# Patient Record
Sex: Female | Born: 1990 | Race: White | Hispanic: No | Marital: Married | Smoking: Never smoker
Health system: Southern US, Community
[De-identification: ages and names within clinical notes are randomized; demographics above are authoritative.]

## PROBLEM LIST (undated history)

## (undated) DIAGNOSIS — K589 Irritable bowel syndrome without diarrhea: Secondary | ICD-10-CM

## (undated) DIAGNOSIS — K7689 Other specified diseases of liver: Secondary | ICD-10-CM

## (undated) DIAGNOSIS — E161 Other hypoglycemia: Secondary | ICD-10-CM

## (undated) HISTORY — PX: NO PAST SURGERIES: SHX2092

## (undated) HISTORY — PX: WISDOM TOOTH EXTRACTION: SHX21

---
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
## 2008-02-17 ENCOUNTER — Encounter: Payer: Self-pay | Admitting: Gastroenterology

## 2008-02-17 ENCOUNTER — Ambulatory Visit: Payer: Self-pay | Admitting: Gastroenterology

## 2008-02-17 LAB — CONVERTED CEMR LAB
AST: 38 units/L — ABNORMAL HIGH (ref 0–37)
BUN: 8 mg/dL (ref 6–23)
Basophils Relative: 1 % (ref 0–1)
Calcium: 10.1 mg/dL (ref 8.4–10.5)
Chloride: 104 meq/L (ref 96–112)
Creatinine, Ser: 0.67 mg/dL (ref 0.40–1.20)
Eosinophils Absolute: 0.1 10*3/uL (ref 0.0–0.7)
Glucose, Bld: 85 mg/dL (ref 70–99)
Hemoglobin: 13.9 g/dL (ref 12.0–15.0)
IgA: 99 mg/dL (ref 68–378)
Lymphs Abs: 2.2 10*3/uL (ref 0.7–4.0)
MCHC: 33.9 g/dL (ref 30.0–36.0)
MCV: 84.7 fL (ref 78.0–100.0)
Monocytes Absolute: 0.5 10*3/uL (ref 0.1–1.0)
Monocytes Relative: 9 % (ref 3–12)
Neutro Abs: 2.6 10*3/uL (ref 1.7–7.7)
RBC: 4.84 M/uL (ref 3.87–5.11)
Tissue Transglutaminase Ab, IgA: 0.1 units (ref <7)

## 2008-02-19 ENCOUNTER — Encounter: Payer: Self-pay | Admitting: Gastroenterology

## 2008-02-25 ENCOUNTER — Ambulatory Visit (HOSPITAL_COMMUNITY): Admission: RE | Admit: 2008-02-25 | Discharge: 2008-02-25 | Payer: Self-pay | Admitting: Internal Medicine

## 2010-07-11 NOTE — Assessment & Plan Note (Signed)
Connie [name], Connie [name]                   CHART#:  [id number]   DATE:  02/17/2008                       DOB:  04-26-1990   CHIEF COMPLAINT:  Diarrhea.   HISTORY OF PRESENT ILLNESS:  The patient is a 20 year old college  student who presents today as a self referral for a 30-month history of  chronic diarrhea.  She does have some alternating constipation with it,  but predominantly diarrhea.  She denies any nocturnal diarrhea.  She  notes mostly during meals, before she can even finish eating she is  having to run to the bathroom.  Stools are loose.  She has 4 or 5 bowel  movements a day.  Denies any blood in the stool or melena.  No nausea or  vomiting.  No weight loss.  She did eliminate dairy productus, but it  did not seem to help.  She stopped eating fast food and started  preparing her own meals, but did not notice any significant difference.  It does not really matter what she eats, she is going to have diarrhea.  She also has diarrhea whenever she gets stressed out in school.  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  She denies any  heartburn, dysphagia, odynophagia, ill contacts, or family history of  IBD.   MEDICATIONS:  NuvaRing.   ALLERGIES:  No known drug allergies.   PAST MEDICAL HISTORY:  Hypoglycemia.   FAMILY HISTORY:  Mother is 6 years old, had some sort of surgery for  history of diarrhea, but the patient is not aware of the details.  No  family history of colon cancer, Crohn disease, or ulcerative colitis to  her knowledge.   SOCIAL HISTORY:  She is single.  She is a full-time Theatre stage manager at  [employer].  She is a nonsmoker, rarely consumes alcohol about once  every couple of months.   REVIEW OF SYSTEMS:  See HPI for GI and constitutional.  Cardiopulmonary:  Denies chest pain, shortness of breath, palpitations, or cough.  Genitourinary:  Denies dysuria or hematuria.  Menstrual cycles regular  for her, on NuvaRing.   PHYSICAL  EXAMINATION:  VITAL SIGNS:  Weight 109, height 5 feet 2 inches,  BMI is 19.9 and normal, temp 97.8, blood pressure 100/70, and pulse 72.  GENERAL:  A pleasant, well-nourished, well-developed Caucasian female in  no acute distress.  SKIN:  Warm and dry.  No jaundice.  HEENT:  Sclerae nonicteric.  Oropharyngeal mucosa moist and pink.  No  lesions, erythema, or exudate.  No lymphadenopathy or thyromegaly.  CHEST:  Lungs are clear to auscultation.  CARDIOVASCULAR:  Regular rate and rhythm.  Normal S1 and S2.  No  murmurs, rubs, or gallops.  ABDOMEN:  Positive bowel sounds.  Abdomen is soft, nontender, and  nondistended.  No organomegaly or masses.  No rebound or guarding.  No  abdominal bruits or hernia.  LOWER EXTREMITIES:  No edema.   IMPRESSION:  The patient is a 20 year old female with a 73-month history  of chronic diarrhea with some intermittent constipation.  Symptoms are  worse postprandially.  I suspect we are dealing with irritable bowel  syndrome, but we do need to consider other possibilities such as  inflammatory bowel disease and celiac disease.  She already eliminated  dairy products  without any relief of her symptoms.  She does not  describe any alarm symptoms at this time.   PLAN:  1. Trial of HyoMax 0.375 mg p.o. b.i.d. p.r.n. diarrhea, #60 with 3      refills.  2. Serum tTG level, IgA level, CBC, TSH, and CMET.  3. Stool for O&P, culture, C. diff, and lactoferrin.  4. Stool for Hemoccult x3.  5. Probiotic 1 daily, #30, samples provided.  6. IBS literature provided.  7. Further recommendations to follow.       Tana Coast, P.A.  Electronically Signed     Kassie Mends, M.D.  Electronically Signed    LL/MEDQ  D:  02/17/2008  T:  02/17/2008  Job:  [id number]

## 2013-06-24 DIAGNOSIS — IMO0002 Reserved for concepts with insufficient information to code with codable children: Secondary | ICD-10-CM | POA: Insufficient documentation

## 2013-06-25 ENCOUNTER — Ambulatory Visit: Payer: Self-pay | Admitting: Cardiology

## 2013-06-30 ENCOUNTER — Encounter: Payer: Self-pay | Admitting: *Deleted

## 2013-08-10 ENCOUNTER — Encounter: Payer: Self-pay | Admitting: Cardiology

## 2013-09-23 ENCOUNTER — Ambulatory Visit (INDEPENDENT_AMBULATORY_CARE_PROVIDER_SITE_OTHER): Payer: BC Managed Care – PPO | Admitting: Cardiology

## 2013-09-23 ENCOUNTER — Encounter: Payer: Self-pay | Admitting: Cardiology

## 2013-09-23 VITALS — BP 112/82 | HR 68 | Ht 62.0 in | Wt 118.0 lb

## 2013-09-23 DIAGNOSIS — R079 Chest pain, unspecified: Secondary | ICD-10-CM

## 2013-09-23 DIAGNOSIS — R002 Palpitations: Secondary | ICD-10-CM

## 2013-09-23 NOTE — Progress Notes (Signed)
      Clinical Summary Connie [name] is a 23 y.o.female  1. Chest pain/Palpitations - approx 1 year. Chest pain is sharp, left chest. 6/10 in severity, can increase 9/10. Can occur at rest or with exertion, though more related to exertion. +SOB. Can have some diaphoresis. Feeling of heart beating fast. Nothing makes pain better or worst. Pain lasts approx 5-15 minutes. Occurs approx once a week. Increased frequency over the last year. ? About stress - exercises regularly, typically runs 1/4 mile, then walks 1/2 mile, then runs another 1/4 mile. Denies symptoms with this activity. - rare caffeine, rare EtOH   CAD risk factors: maternal grandfather MI age 30, mother HTN and tachycardia.    Denies any PMH   No Known Allergies   Current Outpatient Prescriptions  Medication Sig Dispense Refill  . Norgestim-Eth Estrad Triphasic (TRINESSA, 28, PO) Take by mouth.       No current facility-administered medications for this visit.     No past surgical history on file.   No Known Allergies    No family history on file.   Social History Ms. Dormer reports that she has never smoked. She does not have any smokeless tobacco history on file. Ms. Bury has no alcohol history on file.   Review of Systems CONSTITUTIONAL: No weight loss, fever, chills, weakness or fatigue.  HEENT: Eyes: No visual loss, blurred vision, double vision or yellow sclerae.No hearing loss, sneezing, congestion, runny nose or sore throat.  SKIN: No rash or itching.  CARDIOVASCULAR: per HPI RESPIRATORY: No shortness of breath, cough or sputum.  GASTROINTESTINAL: No anorexia, nausea, vomiting or diarrhea. No abdominal pain or blood.  GENITOURINARY: No burning on urination, no polyuria NEUROLOGICAL: No headache, dizziness, syncope, paralysis, ataxia, numbness or tingling in the extremities. No change in bowel or bladder control.  MUSCULOSKELETAL: No muscle, back pain, joint pain or stiffness.  LYMPHATICS:  No enlarged nodes. No history of splenectomy.  PSYCHIATRIC: No history of depression or anxiety.  ENDOCRINOLOGIC: No reports of sweating, cold or heat intolerance. No polyuria or polydipsia.  Marland Kitchen   Physical Examination Filed Vitals:   09/23/13 0834  BP: 112/82  Pulse: 68   Filed Weights   09/23/13 0834  Weight: 118 lb (53.524 kg)    Gen: resting comfortably, no acute distress HEENT: no scleral icterus, pupils equal round and reactive, no palptable cervical adenopathy,  CV: RRR, no m/r/g, no JVD, no carotid bruits Resp: Clear to auscultation bilaterally GI: abdomen is soft, non-tender, non-distended, normal bowel sounds, no hepatosplenomegaly MSK: extremities are warm, no edema.  Skin: warm, no rash Neuro:  no focal deficits Psych: appropriate affect   Diagnostic Studies EKG NSR    Assessment and Plan  1. Chest pain/palpitations - unclear etiology, unlikely ischemic heart disease given her young age and limited risk factors - symptoms could be related to symptomatic arrhythmia - will obtain GXT to evalute exercise functional capacity, for ischemia, and for any exercised induced arrhythmias. If normal, likely obtain 14 day event monitor.   F/u pending test results   Arnoldo Lenis, M.D., F.A.C.C.

## 2013-09-23 NOTE — Patient Instructions (Signed)
Your physician recommends that you schedule a follow-up appointment in: to be determined after stress test    Your physician has requested that you have an exercise tolerance test. For further information please visit [email or web address]. Please also follow instruction sheet, as given.       Thank you for choosing [facility] !

## 2013-09-28 ENCOUNTER — Encounter: Payer: Self-pay | Admitting: Cardiology

## 2013-10-09 ENCOUNTER — Telehealth: Payer: Self-pay

## 2013-10-09 ENCOUNTER — Ambulatory Visit (HOSPITAL_COMMUNITY)
Admission: RE | Admit: 2013-10-09 | Discharge: 2013-10-09 | Disposition: A | Discharge status: Home / Self Care | Payer: BC Managed Care – PPO | Source: Ambulatory Visit | Attending: Cardiology | Admitting: Cardiology

## 2013-10-09 DIAGNOSIS — R079 Chest pain, unspecified: Secondary | ICD-10-CM | POA: Diagnosis not present

## 2013-10-09 DIAGNOSIS — R002 Palpitations: Secondary | ICD-10-CM

## 2013-10-09 NOTE — Telephone Encounter (Signed)
Message copied by Bernita Raisin on Fri Oct 09, 2013  4:20 PM ------      Message from: Corona F      Created: Fri Oct 09, 2013  4:00 PM       Stress test looks good. If she is still having symptoms she needs a 14 day event monitor                  Zandra Abts MD ------

## 2013-10-09 NOTE — Telephone Encounter (Signed)
MD note below relayed to pt

## 2013-10-09 NOTE — Progress Notes (Addendum)
Stress Lab Nurses Notes - Connie [name]  Connie [name] 10/09/2013 Reason for doing test: Chest Pain and Palpitation Type of test: Regular GTX Nurse performing test: Gerrit Halls, RN Nuclear Medicine Tech: Not Applicable Echo Tech: Not Applicable MD performing test: Shayma Pfefferle/K.Purcell Nails NP Family MD: Hilma Favors Test explained and consent signed: Yes.   IV started: No IV started Symptoms: Fatigue in legs Treatment/Intervention: None Reason test stopped: fatigue After recovery IV was: [name] Patient to return to Nuc. Med at : [name] Patient discharged: Home Patient's Condition upon discharge was: stable Comments: During test peak BP 170/82 & HR 187.  Recovery BP 116/61 & HR 102.  Symptoms resolved in recovery.  Connie [name] T  Attending Note  Patient exercised according to the Bruce protocol for 9 min 40 sec achieving 12.3 METs. Resting heart rate increased from 89 bpm to 187 bpm (95% of max predicted) and blood pressure increased from 100/78 to 170/82. The test was stopped due to fatigue, she did not experience any chest pain.   Baseline EKG showed normal sinus rhythm. Stress EKG showed no specific ischemic changes and no significant arrhythmias   Findings 1. Negative stress EKG for ischemia 2. No exercise induced arrhythmias 3. Duke treadmill score of 10, consistent with low risk for major cardiac events   Connie Abts MD

## 2015-02-27 DIAGNOSIS — K7689 Other specified diseases of liver: Secondary | ICD-10-CM

## 2015-02-27 HISTORY — DX: Other specified diseases of liver: K76.89

## 2015-11-17 DIAGNOSIS — N390 Urinary tract infection, site not specified: Secondary | ICD-10-CM | POA: Diagnosis not present

## 2015-11-24 ENCOUNTER — Ambulatory Visit (HOSPITAL_COMMUNITY)
Admission: RE | Admit: 2015-11-24 | Discharge: 2015-11-24 | Disposition: A | Discharge status: Home / Self Care | Payer: BLUE CROSS/BLUE SHIELD | Source: Ambulatory Visit | Attending: Registered Nurse | Admitting: Registered Nurse

## 2015-11-24 ENCOUNTER — Emergency Department (HOSPITAL_COMMUNITY): Payer: BLUE CROSS/BLUE SHIELD

## 2015-11-24 ENCOUNTER — Encounter (HOSPITAL_COMMUNITY): Payer: Self-pay | Admitting: Emergency Medicine

## 2015-11-24 ENCOUNTER — Emergency Department (HOSPITAL_COMMUNITY)
Admission: EM | Admit: 2015-11-24 | Discharge: 2015-11-24 | Disposition: A | Discharge status: Home / Self Care | Payer: BLUE CROSS/BLUE SHIELD | Attending: Emergency Medicine | Admitting: Emergency Medicine

## 2015-11-24 ENCOUNTER — Other Ambulatory Visit (HOSPITAL_COMMUNITY): Payer: Self-pay | Admitting: Registered Nurse

## 2015-11-24 DIAGNOSIS — K7689 Other specified diseases of liver: Secondary | ICD-10-CM | POA: Diagnosis not present

## 2015-11-24 DIAGNOSIS — K219 Gastro-esophageal reflux disease without esophagitis: Secondary | ICD-10-CM | POA: Diagnosis not present

## 2015-11-24 DIAGNOSIS — R102 Pelvic and perineal pain: Secondary | ICD-10-CM | POA: Diagnosis not present

## 2015-11-24 DIAGNOSIS — K589 Irritable bowel syndrome without diarrhea: Secondary | ICD-10-CM | POA: Diagnosis not present

## 2015-11-24 DIAGNOSIS — Z1389 Encounter for screening for other disorder: Secondary | ICD-10-CM | POA: Diagnosis not present

## 2015-11-24 DIAGNOSIS — R160 Hepatomegaly, not elsewhere classified: Secondary | ICD-10-CM

## 2015-11-24 DIAGNOSIS — R1032 Left lower quadrant pain: Secondary | ICD-10-CM | POA: Diagnosis not present

## 2015-11-24 DIAGNOSIS — R109 Unspecified abdominal pain: Secondary | ICD-10-CM

## 2015-11-24 DIAGNOSIS — R1013 Epigastric pain: Secondary | ICD-10-CM | POA: Diagnosis not present

## 2015-11-24 DIAGNOSIS — Z6820 Body mass index (BMI) 20.0-20.9, adult: Secondary | ICD-10-CM | POA: Diagnosis not present

## 2015-11-24 HISTORY — DX: Other hypoglycemia: E16.1

## 2015-11-24 HISTORY — DX: Irritable bowel syndrome, unspecified: K58.9

## 2015-11-24 LAB — CBC WITH DIFFERENTIAL/PLATELET
BASOS ABS: 0 10*3/uL (ref 0.0–0.1)
Basophils Relative: 0 %
Eosinophils Absolute: 0.1 10*3/uL (ref 0.0–0.7)
Eosinophils Relative: 1 %
HEMATOCRIT: 38.8 % (ref 36.0–46.0)
Hemoglobin: 13.4 g/dL (ref 12.0–15.0)
LYMPHS PCT: 33 %
Lymphs Abs: 2.5 10*3/uL (ref 0.7–4.0)
MCH: 30.3 pg (ref 26.0–34.0)
MCHC: 34.5 g/dL (ref 30.0–36.0)
MCV: 87.8 fL (ref 78.0–100.0)
MONO ABS: 0.5 10*3/uL (ref 0.1–1.0)
MONOS PCT: 6 %
NEUTROS ABS: 4.6 10*3/uL (ref 1.7–7.7)
Neutrophils Relative %: 60 %
Platelets: 244 10*3/uL (ref 150–400)
RBC: 4.42 MIL/uL (ref 3.87–5.11)
RDW: 11.7 % (ref 11.5–15.5)
WBC: 7.7 10*3/uL (ref 4.0–10.5)

## 2015-11-24 LAB — URINALYSIS, ROUTINE W REFLEX MICROSCOPIC
Bilirubin Urine: NEGATIVE
GLUCOSE, UA: NEGATIVE mg/dL
HGB URINE DIPSTICK: NEGATIVE
Ketones, ur: 15 mg/dL — AB
Leukocytes, UA: NEGATIVE
Nitrite: NEGATIVE
PH: 7 (ref 5.0–8.0)
Protein, ur: NEGATIVE mg/dL
SPECIFIC GRAVITY, URINE: 1.015 (ref 1.005–1.030)

## 2015-11-24 LAB — COMPREHENSIVE METABOLIC PANEL
ALT: 21 U/L (ref 14–54)
AST: 44 U/L — AB (ref 15–41)
Albumin: 4.2 g/dL (ref 3.5–5.0)
Alkaline Phosphatase: 44 U/L (ref 38–126)
Anion gap: 7 (ref 5–15)
BILIRUBIN TOTAL: 0.8 mg/dL (ref 0.3–1.2)
BUN: 11 mg/dL (ref 6–20)
CALCIUM: 9.1 mg/dL (ref 8.9–10.3)
CO2: 26 mmol/L (ref 22–32)
CREATININE: 0.8 mg/dL (ref 0.44–1.00)
Chloride: 103 mmol/L (ref 101–111)
GFR calc Af Amer: >60 mL/min (ref >60)
Glucose, Bld: 89 mg/dL (ref 65–99)
POTASSIUM: 3.6 mmol/L (ref 3.5–5.1)
Sodium: 136 mmol/L (ref 135–145)
TOTAL PROTEIN: 7.3 g/dL (ref 6.5–8.1)

## 2015-11-24 LAB — WET PREP, GENITAL
Clue Cells Wet Prep HPF POC: NONE SEEN
Sperm: NONE SEEN
Trich, Wet Prep: NONE SEEN
Yeast Wet Prep HPF POC: NONE SEEN

## 2015-11-24 LAB — PREGNANCY, URINE: PREG TEST UR: NEGATIVE

## 2015-11-24 LAB — LIPASE, BLOOD: LIPASE: 22 U/L (ref 11–51)

## 2015-11-24 MED ORDER — OMEPRAZOLE 20 MG PO CPDR: 20.0000 mg | DELAYED_RELEASE_CAPSULE | Freq: Every day | ORAL | 0 refills | Status: DC

## 2015-11-24 NOTE — Discharge Instructions (Signed)
Follow-up with your doctor for the MRI of your liver. Take the stomach medication as prescribed. Return to the ED if you develop new or worsening symptoms.

## 2015-11-24 NOTE — ED Provider Notes (Signed)
[facility] DEPT Provider Note   CSN: [id number] Arrival date & time: 11/24/15  1459     History   Chief Complaint Chief Complaint  Patient presents with  . Abdominal Pain    HPI Connie [name] is a 25 y.o. female.  Patient sent by PCP for abnormal abdominal CT. Reports she's had lower abdominal pain for the past 2 weeks it is fairly constant. She was seen by urgent care and told no UTI was given antibiotics anyway which she completed. Pain is constant in the left side of her lower abdomen. Nothing makes it better or worse. No vomiting or fever. Normal bowel movements. Does have a history of IBS. Eating and drinking well. No fever. No abnormal bleeding or discharge. CT scan obtained by PCP today showed abdomen in the liver consistent with mass or cyst. She was sent here for further workup including possible MRI even though this does not correlate with her area of pain.   The history is provided by the patient.  Abdominal Pain   Pertinent negatives include diarrhea, nausea, vomiting, constipation and dysuria.    Past Medical History:  Diagnosis Date  . Hypoglycemic reaction   . IBS (irritable bowel syndrome)     Patient Active Problem List   Diagnosis Date Noted  . Body mass index between 19-24, adult 06/24/2013    Past Surgical History:  Procedure Laterality Date  . WISDOM TOOTH EXTRACTION      OB History    No data available       Home Medications    Prior to Admission medications   Medication Sig Start Date End Date Taking? Authorizing Provider  Norgestim-Eth Estrad Triphasic (TRINESSA, 28, PO) Take by mouth.   Yes Historical Provider, MD    Family History Family History  Problem Relation Age of Onset  . Hypertension Mother   . Heart attack Other     Social History Social History  Substance Use Topics  . Smoking status: Never Smoker  . Smokeless tobacco: Never Used  . Alcohol use No     Allergies   Review of patient's allergies indicates no  known allergies.   Review of Systems Review of Systems  Constitutional: Negative for activity change and appetite change.  HENT: Negative for congestion and rhinorrhea.   Respiratory: Negative for cough, chest tightness and shortness of breath.   Cardiovascular: Negative for chest pain.  Gastrointestinal: Positive for abdominal pain. Negative for constipation, diarrhea, nausea and vomiting.  Genitourinary: Positive for pelvic pain. Negative for dysuria and vaginal discharge.  Neurological: Negative for dizziness, seizures, light-headedness and numbness.  A complete 10 system review of systems was obtained and all systems are negative except as noted in the HPI and PMH.     Physical Exam Updated Vital Signs BP 115/72 (BP Location: Left Arm)   Pulse 88   Temp 97.7 F (36.5 C) (Oral)   Resp 18   Ht 5\' 2"  (1.575 m)   Wt 110 lb (49.9 kg)   LMP 11/14/2015   SpO2 99%   BMI 20.12 kg/m   Physical Exam  Constitutional: She is oriented to person, place, and time. She appears well-developed and well-nourished. No distress.  HENT:  Head: Normocephalic and atraumatic.  Mouth/Throat: Oropharynx is clear and moist. No oropharyngeal exudate.  Eyes: Conjunctivae and EOM are normal. Pupils are equal, round, and reactive to light.  Neck: Normal range of motion. Neck supple.  No meningismus.  Cardiovascular: Normal rate, regular rhythm, normal heart sounds and intact  distal pulses.   No murmur heard. Pulmonary/Chest: Effort normal and breath sounds normal. No respiratory distress.  Abdominal: Soft. There is tenderness. There is no rebound and no guarding.  TTP suprapubic and LLQ pain without guarding or rebound  Genitourinary:  Genitourinary Comments: Chaperone present. Normal external genitalia. White discharge in vaginal vault. No CMT. There is midline uterine tenderness and left adnexal tenderness. No right adnexal tenderness  Musculoskeletal: Normal range of motion. She exhibits no edema  or tenderness.  Neurological: She is alert and oriented to person, place, and time. No cranial nerve deficit. She exhibits normal muscle tone. Coordination normal.  No ataxia on finger to nose bilaterally. No pronator drift. 5/5 strength throughout. CN 2-12 intact.Equal grip strength. Sensation intact.   Skin: Skin is warm.  Psychiatric: She has a normal mood and affect. Her behavior is normal.  Nursing note and vitals reviewed.    ED Treatments / Results  Labs (all labs ordered are listed, but only abnormal results are displayed) Labs Reviewed  WET PREP, GENITAL - Abnormal; Notable for the following:       Result Value   WBC, Wet Prep HPF POC MODERATE (*)    All other components within normal limits  URINALYSIS, ROUTINE W REFLEX MICROSCOPIC (NOT AT [facility]) - Abnormal; Notable for the following:    Ketones, ur 15 (*)    All other components within normal limits  COMPREHENSIVE METABOLIC PANEL - Abnormal; Notable for the following:    AST 44 (*)    All other components within normal limits  PREGNANCY, URINE  CBC WITH DIFFERENTIAL/PLATELET  LIPASE, BLOOD  GC/CHLAMYDIA PROBE AMP ([place]) NOT AT [facility]    EKG  EKG Interpretation None       Radiology Ct Renal Stone Study  Result Date: 11/24/2015 CLINICAL DATA:  Abdominal pain, diffuse. EXAM: CT ABDOMEN AND PELVIS WITHOUT CONTRAST TECHNIQUE: Multidetector CT imaging of the abdomen and pelvis was performed following the standard protocol without IV contrast. COMPARISON:  Small bowel follow-through fluoroscopy 02/25/2008 FINDINGS: Lower chest: No pulmonary nodules or pleural effusion. No visible pericardial effusion. Hepatobiliary: There is a focal abnormality along the inferior contour of the right hepatic lobe (axial image 32, coronal image 26) with a central area of hypoattenuation. This masslike area measures approximately 7 x 5 cm. The remainder of the liver is normal. The gallbladder is normal. There is no ascites. Pancreas:  Normal noncontrast appearance of the pancreas. No peripancreatic fluid collection. Spleen: Normal. Adrenal glands: Normal. Urinary Tract: --Right kidney: No hydronephrosis or perinephric stranding. No nephrolithiasis. No obstructing ureteral stones. --Left kidney: No hydronephrosis or perinephric stranding. No nephrolithiasis. No obstructing ureteral stones. --Urinary bladder: Unremarkable. Stomach/Bowel: No dilated loops of bowel. No evidence of colonic or enteric inflammation. No fluid collection within the abdomen. The appendix could not be adequately visualized. However, there is no inflammatory stranding or free fluid in the right lower quadrant. Vascular/Lymphatic: No abdominal aortic aneurysm or atherosclerotic calcification. No abdominal or pelvic lymphadenopathy. Reproductive: Normal uterus and ovaries. Musculoskeletal. No focal osseous lesion. Normal visualized extraperitoneal and extrathoracic soft tissues. IMPRESSION: 1. Inferior right hepatic lobe mass measuring approximately 7 x 5 cm, with a central area of hypoattenuation. 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 MRI of the abdomen with and without IV contrast, using a biliary specific contrast agent such as Eovist, is recommended for further characterization. 2. No acute abnormality of the abdomen or pelvis. 3. No obstructive uropathy  or nephrolithiasis. Electronically Signed   By: Ulyses Jarred M.D.   On: 11/24/2015 14:13    Procedures Procedures (including critical care time)  Medications Ordered in ED Medications - No data to display   Initial Impression / Assessment and Plan / ED Course  I have reviewed the triage vital signs and the nursing notes.  Pertinent labs & imaging results that were available during my care of the patient were reviewed by me and considered in my medical decision making (see chart for details).  Clinical Course   2 weeks of lower  abdominal pain without associated symptoms.  CT shows R hepatic lobe mass concerning for adenoma versus focal nodular hyperplasia.  Discussed with patient that her lower abdominal pain is likely not explained by her liver abnormality. Discussed the MRI can be obtained but it might cost her more when ordered as an emergent test. Patient states she does not want the MRI today and will follow-up with her PCP to get this scheduled as an outpatient.  Pelvic exam as above with left adnexal tenderness. We'll obtain pelvic ultrasound. Urinalysis is negative. Labs are reassuring.  Pelvic ultrasound is normal. No evidence of ovarian torsion or cyst.  Patient to follow-up with PCP as well as GI. We'll start PPI for her epigastric pain. She understands need for MRI follow-up regarding liver findings on CT. Return precautions discussed.  Final Clinical Impressions(s) / ED Diagnoses   Final diagnoses:  Pelvic pain in female  Abdominal pain, unspecified abdominal location  Liver mass    New Prescriptions New Prescriptions   No medications on file     Ezequiel Essex, MD 11/25/15 0121

## 2015-11-24 NOTE — ED Triage Notes (Signed)
Pt reports lower abdominal pain that started 2 weeks ago. Pt states she was seen by PCP today, had CT scan which showed an abnormality on her liver. Sent by PCP for further follow-up.

## 2015-11-24 NOTE — ED Notes (Signed)
Pt having generalized pressure in abdomen, went to doctor today and had ct done, found a possible mass or cyst on liver.  Pt has  Had normal bms and no urinary symptoms.  Pt reports a "pressure" in her abdomen after having BM.  Pt alert and oriented, here to have MRI and bloodwork done.

## 2015-11-25 ENCOUNTER — Encounter: Payer: Self-pay | Admitting: Gastroenterology

## 2015-11-25 ENCOUNTER — Ambulatory Visit (HOSPITAL_COMMUNITY)
Admission: RE | Admit: 2015-11-25 | Discharge: 2015-11-25 | Disposition: A | Discharge status: Home / Self Care | Payer: BLUE CROSS/BLUE SHIELD | Source: Ambulatory Visit | Attending: Gastroenterology | Admitting: Gastroenterology

## 2015-11-25 ENCOUNTER — Ambulatory Visit (INDEPENDENT_AMBULATORY_CARE_PROVIDER_SITE_OTHER): Payer: BLUE CROSS/BLUE SHIELD | Admitting: Gastroenterology

## 2015-11-25 ENCOUNTER — Telehealth: Payer: Self-pay | Admitting: Gastroenterology

## 2015-11-25 ENCOUNTER — Other Ambulatory Visit: Payer: Self-pay

## 2015-11-25 ENCOUNTER — Telehealth: Payer: Self-pay

## 2015-11-25 VITALS — BP 119/72 | HR 101 | Temp 97.2°F | Ht 62.0 in | Wt 112.6 lb

## 2015-11-25 DIAGNOSIS — R160 Hepatomegaly, not elsewhere classified: Secondary | ICD-10-CM

## 2015-11-25 DIAGNOSIS — R1030 Lower abdominal pain, unspecified: Secondary | ICD-10-CM | POA: Diagnosis not present

## 2015-11-25 DIAGNOSIS — R1013 Epigastric pain: Secondary | ICD-10-CM | POA: Diagnosis not present

## 2015-11-25 DIAGNOSIS — N281 Cyst of kidney, acquired: Secondary | ICD-10-CM | POA: Diagnosis not present

## 2015-11-25 DIAGNOSIS — R1032 Left lower quadrant pain: Secondary | ICD-10-CM | POA: Insufficient documentation

## 2015-11-25 DIAGNOSIS — K7689 Other specified diseases of liver: Secondary | ICD-10-CM | POA: Diagnosis not present

## 2015-11-25 LAB — GC/CHLAMYDIA PROBE AMP (~~LOC~~) NOT AT ARMC
Chlamydia: NEGATIVE
Neisseria Gonorrhea: NEGATIVE

## 2015-11-25 MED ORDER — GADOXETATE DISODIUM 0.25 MMOL/ML IV SOLN
5.0000 mL | Freq: Once | INTRAVENOUS | Status: AC | PRN
  Administered 2015-11-25: 5 mL via INTRAVENOUS

## 2015-11-25 NOTE — Progress Notes (Signed)
Primary Care Physician:  Timblin  Primary Gastroenterologist:  Garfield Cornea, MD   Chief Complaint  Patient presents with  . Abdominal Pain    HPI:  Connie [name] is a 25 y.o. female here for urgent office visit for further evaluation of liver mass, abdominal pain.  Patient was seen by our practice remotely for bowel issues suspicious for IBS. Small bowel follow-through negative at that time. She also had negative celiac serologies. Over this course of several years, patient has baseline postprandial abdominal bloating/distention associated with lower abdominal discomfort. Usually followed by diarrhea. She has been able to manage her symptoms fairly well sticking with Paleo diet/avoiding gluten.  Several weeks ago she received a Z-Pak for sinusitis. Shortly after those symptoms she began having constant pressure-like discomfort in the lower abdomen predominantly left-sided. Pain persisted, she cannot get in to see her PCP so she went to urgent care. She thought she may have a UTI but her urine checked out okay. She was on her period at that time but did not feel like menstrual-related issues. She was given an antibiotic NP or clue for possible early UTI. She completed antibiotic without any improvement of her symptoms. She went back to see her PCP who ordered a CT renal protocol, no IV contrast given. This was performed yesterday. She was noted to have focal abnormality along the inferior contour the right hepatic lobe with central area of hypoattenuation measuring 7 x 5 cm. PCP recommended she go to the emergency department for urgent MRI. One seen in the emergency department, urgent MRI was not encouraged. She did undergo evaluation for her lower abdominal pain. Pelvic exam was performed. She also received pelvic ultrasound which was unremarkable. Pregnancy test was negative. Urinalysis unremarkable. CBC, lipase, any function normal. AST was 44.  Patient states that her  bowel movements have been more regular than usual although stool consistency is more of constipation. Really gets no relief with with her abdominal discomfort after bowel movements. Denies dysuria. She also notes epigastric discomfort which is new, severe heartburn 3 times in the past week. ER physician provided her with Prilosec prescription but she hasn't started yet. She's never had heartburn before. Denies nausea or vomiting. No fever.     Current Outpatient Prescriptions  Medication Sig Dispense Refill  . Probiotic Product (PROBIOTIC DAILY PO) Take by mouth daily.     No current facility-administered medications for this visit.     Allergies as of 11/25/2015  . (No Known Allergies)    Past Medical History:  Diagnosis Date  . Hypoglycemic reaction   . IBS (irritable bowel syndrome)     Past Surgical History:  Procedure Laterality Date  . WISDOM TOOTH EXTRACTION      Family History  Problem Relation Age of Onset  . Hypertension Mother   . Heart attack Other     Social History   Social History  . Marital status: Married    Spouse name: N/A  . Number of children: N/A  . Years of education: N/A   Occupational History  . Not on file.   Social History Main Topics  . Smoking status: Never Smoker  . Smokeless tobacco: Never Used  . Alcohol use No  . Drug use: No  . Sexual activity: Yes    Birth control/ protection: Pill   Other Topics Concern  . Not on file   Social History Narrative  . No narrative on file      ROS:  General:  Negative for anorexia, weight loss, fever, chills, fatigue, weakness. Eyes: Negative for vision changes.  ENT: Negative for hoarseness, difficulty swallowing , nasal congestion. CV: Negative for chest pain, angina, palpitations, dyspnea on exertion, peripheral edema.  Respiratory: Negative for dyspnea at rest, dyspnea on exertion, cough, sputum, wheezing.  GI: See history of present illness. GU:  Negative for dysuria, hematuria,  urinary incontinence, urinary frequency, nocturnal urination.  MS: Negative for joint pain, low back pain.  Derm: Negative for rash or itching.  Neuro: Negative for weakness, abnormal sensation, seizure, frequent headaches, memory loss, confusion.  Psych: Negative for anxiety, depression, suicidal ideation, hallucinations.  Endo: Negative for unusual weight change.  Heme: Negative for bruising or bleeding. Allergy: Negative for rash or hives.    Physical Examination:  BP 119/72   Pulse (!) 101   Temp 97.2 F (36.2 C) (Oral)   Ht 5\' 2"  (1.575 m)   Wt 112 lb 9.6 oz (51.1 kg)   LMP 11/14/2015   BMI 20.59 kg/m    General: Well-nourished, well-developed in no acute distress.  Head: Normocephalic, atraumatic.   Eyes: Conjunctiva pink, no icterus. Mouth: Oropharyngeal mucosa moist and pink , no lesions erythema or exudate. Neck: Supple without thyromegaly, masses, or lymphadenopathy.  Lungs: Clear to auscultation bilaterally.  Heart: Regular rate and rhythm, no murmurs rubs or gallops.  Abdomen: Bowel sounds are normal, mild epigastric and left lower quadrant tenderness with palpation, mild tenderness in the right upper quadrant with deep inspiration nondistended, no hepatosplenomegaly or masses, no abdominal bruits or    hernia , no rebound or guarding.   Rectal: Not performed Extremities: No lower extremity edema. No clubbing or deformities.  Neuro: Alert and oriented x 4 , grossly normal neurologically.  Skin: Warm and dry, no rash or jaundice.   Psych: Alert and cooperative, normal mood and affect.  Labs: Lab Results  Component Value Date   LIPASE 22 11/24/2015   Lab Results  Component Value Date   CREATININE 0.80 11/24/2015   BUN 11 11/24/2015   NA 136 11/24/2015   K 3.6 11/24/2015   CL 103 11/24/2015   CO2 26 11/24/2015   Lab Results  Component Value Date   ALT 21 11/24/2015   AST 44 (H) 11/24/2015   ALKPHOS 44 11/24/2015   BILITOT 0.8 11/24/2015   Lab Results   Component Value Date   WBC 7.7 11/24/2015   HGB 13.4 11/24/2015   HCT 38.8 11/24/2015   MCV 87.8 11/24/2015   PLT 244 11/24/2015     Imaging Studies: US Transvaginal Non-ob  Result Date: 11/24/2015 CLINICAL DATA:  Pelvic pain for 2 weeks.  LMP 11/14/2015. EXAM: TRANSABDOMINAL AND TRANSVAGINAL ULTRASOUND OF PELVIS DOPPLER ULTRASOUND OF OVARIES TECHNIQUE: Both transabdominal and transvaginal ultrasound examinations of the pelvis were performed. Transabdominal technique was performed for global imaging of the pelvis including uterus, ovaries, adnexal regions, and pelvic cul-de-sac. It was necessary to proceed with endovaginal exam following the transabdominal exam to visualize the endometrium and ovaries. Color and duplex Doppler ultrasound was utilized to evaluate blood flow to the ovaries. COMPARISON:  None. FINDINGS: Uterus Measurements: 6.4 x 2.3 x 3.5 cm. No fibroids or other mass visualized. Endometrium Thickness: 3 mm.  No focal abnormality visualized. Right ovary Measurements: 3.1 x 2.2 x 2.2 cm. Normal appearance/no adnexal mass. Left ovary Measurements: 2.6 x 1.8 x 2.8 cm. Normal appearance/no adnexal mass. Pulsed Doppler evaluation of both ovaries demonstrates normal low-resistance arterial and venous waveforms. Other findings No abnormal free fluid.  IMPRESSION: Normal appearance of uterus and ovaries. No pelvic mass or other sonographic abnormality demonstrated. No sonographic evidence for ovarian torsion. Electronically Signed   By: Earle Gell M.D.   On: 11/24/2015 17:32   US Pelvis Complete  Result Date: 11/24/2015 CLINICAL DATA:  Pelvic pain for 2 weeks.  LMP 11/14/2015. EXAM: TRANSABDOMINAL AND TRANSVAGINAL ULTRASOUND OF PELVIS DOPPLER ULTRASOUND OF OVARIES TECHNIQUE: Both transabdominal and transvaginal ultrasound examinations of the pelvis were performed. Transabdominal technique was performed for global imaging of the pelvis including uterus, ovaries, adnexal regions, and pelvic  cul-de-sac. It was necessary to proceed with endovaginal exam following the transabdominal exam to visualize the endometrium and ovaries. Color and duplex Doppler ultrasound was utilized to evaluate blood flow to the ovaries. COMPARISON:  None. FINDINGS: Uterus Measurements: 6.4 x 2.3 x 3.5 cm. No fibroids or other mass visualized. Endometrium Thickness: 3 mm.  No focal abnormality visualized. Right ovary Measurements: 3.1 x 2.2 x 2.2 cm. Normal appearance/no adnexal mass. Left ovary Measurements: 2.6 x 1.8 x 2.8 cm. Normal appearance/no adnexal mass. Pulsed Doppler evaluation of both ovaries demonstrates normal low-resistance arterial and venous waveforms. Other findings No abnormal free fluid. IMPRESSION: Normal appearance of uterus and ovaries. No pelvic mass or other sonographic abnormality demonstrated. No sonographic evidence for ovarian torsion. Electronically Signed   By: Earle Gell M.D.   On: 11/24/2015 17:32   [place] Art/ven Flow Abd Pelv Doppler  Result Date: 11/24/2015 CLINICAL DATA:  Pelvic pain for 2 weeks.  LMP 11/14/2015. EXAM: TRANSABDOMINAL AND TRANSVAGINAL ULTRASOUND OF PELVIS DOPPLER ULTRASOUND OF OVARIES TECHNIQUE: Both transabdominal and transvaginal ultrasound examinations of the pelvis were performed. Transabdominal technique was performed for global imaging of the pelvis including uterus, ovaries, adnexal regions, and pelvic cul-de-sac. It was necessary to proceed with endovaginal exam following the transabdominal exam to visualize the endometrium and ovaries. Color and duplex Doppler ultrasound was utilized to evaluate blood flow to the ovaries. COMPARISON:  None. FINDINGS: Uterus Measurements: 6.4 x 2.3 x 3.5 cm. No fibroids or other mass visualized. Endometrium Thickness: 3 mm.  No focal abnormality visualized. Right ovary Measurements: 3.1 x 2.2 x 2.2 cm. Normal appearance/no adnexal mass. Left ovary Measurements: 2.6 x 1.8 x 2.8 cm. Normal appearance/no adnexal mass. Pulsed Doppler  evaluation of both ovaries demonstrates normal low-resistance arterial and venous waveforms. Other findings No abnormal free fluid. IMPRESSION: Normal appearance of uterus and ovaries. No pelvic mass or other sonographic abnormality demonstrated. No sonographic evidence for ovarian torsion. Electronically Signed   By: Earle Gell M.D.   On: 11/24/2015 17:32   Ct Renal Stone Study  Result Date: 11/24/2015 CLINICAL DATA:  Abdominal pain, diffuse. EXAM: CT ABDOMEN AND PELVIS WITHOUT CONTRAST TECHNIQUE: Multidetector CT imaging of the abdomen and pelvis was performed following the standard protocol without IV contrast. COMPARISON:  Small bowel follow-through fluoroscopy 02/25/2008 FINDINGS: Lower chest: No pulmonary nodules or pleural effusion. No visible pericardial effusion. Hepatobiliary: There is a focal abnormality along the inferior contour of the right hepatic lobe (axial image 32, coronal image 26) with a central area of hypoattenuation. This masslike area measures approximately 7 x 5 cm. The remainder of the liver is normal. The gallbladder is normal. There is no ascites. Pancreas: Normal noncontrast appearance of the pancreas. No peripancreatic fluid collection. Spleen: Normal. Adrenal glands: Normal. Urinary Tract: --Right kidney: No hydronephrosis or perinephric stranding. No nephrolithiasis. No obstructing ureteral stones. --Left kidney: No hydronephrosis or perinephric stranding. No nephrolithiasis. No obstructing ureteral stones. --Urinary bladder: Unremarkable. Stomach/Bowel:  No dilated loops of bowel. No evidence of colonic or enteric inflammation. No fluid collection within the abdomen. The appendix could not be adequately visualized. However, there is no inflammatory stranding or free fluid in the right lower quadrant. Vascular/Lymphatic: No abdominal aortic aneurysm or atherosclerotic calcification. No abdominal or pelvic lymphadenopathy. Reproductive: Normal uterus and ovaries. Musculoskeletal.  No focal osseous lesion. Normal visualized extraperitoneal and extrathoracic soft tissues. IMPRESSION: 1. Inferior right hepatic lobe mass measuring approximately 7 x 5 cm, with a central area of hypoattenuation. 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 MRI of the abdomen with and without IV contrast, using a biliary specific contrast agent such as Eovist, is recommended for further characterization. 2. No acute abnormality of the abdomen or pelvis. 3. No obstructive uropathy or nephrolithiasis. Electronically Signed   By: Ulyses Jarred M.D.   On: 11/24/2015 14:13

## 2015-11-25 NOTE — Patient Instructions (Signed)
1. Start omeprazole as recommended. 2. MRI liver as scheduled.  3. I will touch base with Dr. Gala Romney regarding your symptoms. I will call you later today with recommendations.

## 2015-11-25 NOTE — Telephone Encounter (Signed)
Called pt to inform of stat MRI scheduled at [facility] today at 3:00pm. NPO 6-8 hours before. Pt stated she is hypoglycemic and hasn't had anything to eat today. She asked if she could have hard candy. Per radiology at [facility] she can have hard candy but none that is creamy or red. Advised pt.

## 2015-11-25 NOTE — Progress Notes (Signed)
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 Benign renal cysts as well. Spoke with the patient. Etiology of her lower abd pain not well defined. Suggested prilosec for epig pain. Probiotic and paleo/gluten free diet which we have discussed in past. Symptoms began after zpak and she had abx for empirical tx of UTI. No diarrhea so Cdiff is not likely. ?IBS flare.   Patient to call when she gets back in town with progress report or sooner if neededn.  Repeat MRI Abd with and WO contrast (use Eovist) to document stability of liver mass.

## 2015-11-25 NOTE — Telephone Encounter (Signed)
I personally spoke to MRI Tech at [facility]. They are aware of indications for MRI. Both liver lesion and pelvic pain/LLQ pain.  They will update order accordingly.

## 2015-11-25 NOTE — Assessment & Plan Note (Signed)
25 year old female with several week history of lower abdominal discomfort predominantly left lower quadrant, epigastric discomfort seemingly unrelated to bowel function. Baseline bowel issues for years, suspect did IBS. Current symptoms seem to be different. No urinary symptoms. Chronic postprandial bloating and bowel issues generally controlled with diet.  Patient underwent noncontrast CT which revealed a 5 x 7 cm abnormality in the inferior right hepatic lobe which needs to be evaluated. It is unlikely that this finding explains her new onset lower abdominal discomfort. I discussed case with Dr. Gala Romney, he recommends MRI abdomen and pelvis today with without contrast, needs EOVIST contrast to evaluate liver abnormality. Patient willing. Also will go ahead and start omeprazole for epigastric discomfort. Further recommendations to follow.

## 2015-11-28 NOTE — Progress Notes (Signed)
CC'D TO PCP °

## 2015-12-02 ENCOUNTER — Telehealth: Payer: Self-pay | Admitting: Internal Medicine

## 2015-12-02 NOTE — Telephone Encounter (Signed)
Pt was seen recently by LSL and was told to call back to let us know how she was doing after her trip. She said that LSL could call her back on Monday at [phone] (epic has [phone])

## 2015-12-02 NOTE — Telephone Encounter (Signed)
Routing to LSL 

## 2015-12-05 ENCOUNTER — Ambulatory Visit (HOSPITAL_COMMUNITY): Payer: BLUE CROSS/BLUE SHIELD

## 2015-12-05 NOTE — Telephone Encounter (Signed)
Tried to call pt- NA- LMOM 

## 2015-12-05 NOTE — Telephone Encounter (Signed)
Please contact patient for progress report and we will go from there.

## 2015-12-07 NOTE — Progress Notes (Signed)
Dr. Gala Romney, can you look at MRI findings? Do you want me to repeat MRI in 6 months?

## 2015-12-08 NOTE — Telephone Encounter (Signed)
Tried to call pt- NA- LMOM 

## 2015-12-09 NOTE — Telephone Encounter (Signed)
Pt called- she said everything has seemed to calm down for now. She is taking the prilosec prn and the probiotic daily and it helps. She said she is watching her diet and popcorn makes things worse. She said she also noticed that her lower abd pain is worse after intercourse and she is going to call her GYN about this.

## 2015-12-19 NOTE — Telephone Encounter (Signed)
Please make sure patient is aware of RMR recommendations, copy and pasted from the MR result note.  "May be an incidental finding BUT a 7.7 cm hepatic mass is pretty big. Agree with the recent recommendations given. However, in the next several weeks if patient's right-sided symptoms are not improved, I think we need to keep in mind the possibility that the hepatic mass is somehow contributing to her right-sided abdominal pain although pain not anatomically located in close proximity to the liver. I would not wait 6-12 months to repeat image, particularly if persisting symptoms. If symptoms persist, would consider consultation with a hepatobiliary surgeon at [facility]."   So, at this point, we will plan on Repeat MRI Abd with and WO contrast (use Eovist) to document stability of liver mass in 03/2016.  If persistent symptoms, then send to hepatobiliary surgeon at [facility].   Return here to see RMR only in 6-8 months.

## 2015-12-19 NOTE — Progress Notes (Signed)
See separate telephone note.

## 2015-12-20 DIAGNOSIS — Z6820 Body mass index (BMI) 20.0-20.9, adult: Secondary | ICD-10-CM | POA: Diagnosis not present

## 2015-12-20 DIAGNOSIS — Z01419 Encounter for gynecological examination (general) (routine) without abnormal findings: Secondary | ICD-10-CM | POA: Diagnosis not present

## 2015-12-27 NOTE — Telephone Encounter (Signed)
Tried to call pt- NA- LMOM 

## 2016-01-01 NOTE — Telephone Encounter (Signed)
Connie [name], when you talk with make sure she gets NIC for MRI as outlined 03/2016.  I also meant for the OV with RMR only to be in 6-8 WEEKS not months.

## 2016-01-02 ENCOUNTER — Encounter: Payer: Self-pay | Admitting: Internal Medicine

## 2016-01-02 NOTE — Telephone Encounter (Signed)
Tried to call pt- NA- LMOM with recommendations.  

## 2016-01-02 NOTE — Telephone Encounter (Signed)
ON RECALL AND APPOINTMENT MADE °

## 2016-02-07 ENCOUNTER — Ambulatory Visit (INDEPENDENT_AMBULATORY_CARE_PROVIDER_SITE_OTHER): Payer: BLUE CROSS/BLUE SHIELD | Admitting: Internal Medicine

## 2016-02-07 ENCOUNTER — Encounter: Payer: Self-pay | Admitting: Internal Medicine

## 2016-02-07 VITALS — BP 118/73 | HR 87 | Temp 98.3°F | Ht 62.0 in | Wt 111.4 lb

## 2016-02-07 DIAGNOSIS — R160 Hepatomegaly, not elsewhere classified: Secondary | ICD-10-CM | POA: Diagnosis not present

## 2016-02-07 DIAGNOSIS — K588 Other irritable bowel syndrome: Secondary | ICD-10-CM | POA: Diagnosis not present

## 2016-02-07 MED ORDER — HYOSCYAMINE SULFATE SL 0.125 MG SL SUBL: 0.1250 mg | SUBLINGUAL_TABLET | Freq: Three times a day (TID) | SUBLINGUAL | 3 refills | Status: DC

## 2016-02-07 NOTE — Patient Instructions (Signed)
Hepatic profile today  Keep Appointment for repeat MRI in February  Trial of Levsin .125 mg - one under the tongue before meals and at bedtime as needed for abdominal symptoms  Further recommendations to follow

## 2016-02-10 ENCOUNTER — Telehealth: Payer: Self-pay | Admitting: Internal Medicine

## 2016-02-10 ENCOUNTER — Other Ambulatory Visit (HOSPITAL_COMMUNITY)
Admission: RE | Admit: 2016-02-10 | Discharge: 2016-02-10 | Disposition: A | Discharge status: Home / Self Care | Payer: BLUE CROSS/BLUE SHIELD | Source: Ambulatory Visit | Attending: Internal Medicine | Admitting: Internal Medicine

## 2016-02-10 DIAGNOSIS — R160 Hepatomegaly, not elsewhere classified: Secondary | ICD-10-CM | POA: Insufficient documentation

## 2016-02-10 LAB — HEPATIC FUNCTION PANEL
ALBUMIN: 4.6 g/dL (ref 3.5–5.0)
ALT: 27 U/L (ref 14–54)
AST: 51 U/L — ABNORMAL HIGH (ref 15–41)
Alkaline Phosphatase: 52 U/L (ref 38–126)
BILIRUBIN TOTAL: 0.9 mg/dL (ref 0.3–1.2)
Bilirubin, Direct: 0.1 mg/dL (ref 0.1–0.5)
Indirect Bilirubin: 0.8 mg/dL (ref 0.3–0.9)
TOTAL PROTEIN: 7.9 g/dL (ref 6.5–8.1)

## 2016-02-10 NOTE — Telephone Encounter (Signed)
noted 

## 2016-02-10 NOTE — Telephone Encounter (Signed)
Patient called to let us know that she had her lab work done this morning. She is aware that the nurse would contact her with results when available.

## 2016-02-17 ENCOUNTER — Ambulatory Visit: Payer: BLUE CROSS/BLUE SHIELD | Admitting: Internal Medicine

## 2016-02-26 NOTE — Progress Notes (Signed)
Primary Care Physician:  [facility] Primary Gastroenterologist:  Dr. Hilma Favors  Pre-Procedure History & Physical: HPI:  Connie [name] is a 25 y.o. female here for follow up of crampy llq abd pain and tendency towards diarrhea along with right lobe liver mass. 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  Minimal elevation in AST previously.  Pt continue to deny any right sided abd symptoms.   Chronic GI sx have been intermittant over a period of years.  Hx of IBS.  No family hx of liver disease. On BCP's.  Desires to start a family in 2018.  Past Medical History:  Diagnosis Date  . Hypoglycemic reaction   . IBS (irritable bowel syndrome)     Past Surgical History:  Procedure Laterality Date  . WISDOM TOOTH EXTRACTION      Prior to Admission medications   Medication Sig Start Date End Date Taking? Authorizing Provider  prenatal vitamin w/FE, FA (PRENATAL 1 + 1) 27-1 MG TABS tablet Take 1 tablet by mouth daily at 12 noon.   Yes Historical Provider, MD  Probiotic Product (PROBIOTIC DAILY PO) Take by mouth daily.   Yes Historical Provider, MD  Hyoscyamine Sulfate SL (LEVSIN/SL) 0.125 MG SUBL Place 0.125 mg under the tongue 4 (four) times daily -  before meals and at bedtime. 02/07/16   Daneil Dolin, MD    Allergies as of 02/07/2016  . (No Known Allergies)    Family History  Problem Relation Age of Onset  . Hypertension Mother   . Heart attack Other     Social History   Social History  . Marital status: Married    Spouse name: N/A  . Number of children: N/A  . Years of education: N/A   Occupational History  . Not on file.   Social History Main Topics  . Smoking status: Never Smoker  . Smokeless tobacco: Never Used  . Alcohol use No  . Drug use: No  . Sexual activity: Yes    Birth control/ protection: Pill   Other Topics Concern  . Not on file   Social History Narrative  .  No narrative on file    Review of Systems: See HPI, otherwise negative ROS  Physical Exam: BP 118/73   Pulse 87   Temp 98.3 F (36.8 C) (Oral)   Ht 5\' 2"  (1.575 m)   Wt 111 lb 6.4 oz (50.5 kg)   LMP 02/06/2016   BMI 20.38 kg/m  General:   Alert,  Well-developed, well-nourished, pleasant and cooperative in NAD Skin:  Intact without significant lesions or rashes. Eyes:  Sclera clear, no icterus.   Conjunctiva pink. Lungs:  Clear throughout to auscultation.   No wheezes, crackles, or rhonchi. No acute distress. Heart:  Regular rate and rhythm; no murmurs, clicks, rubs,  or gallops. Abdomen: Non-distended, normal bowel sounds.  Soft and nontender without appreciable mass or hepatosplenomegaly.  Pulses:  Normal pulses noted. Extremities:  Without clubbing or edema.  Impression:   25 y/o lady with longstanding fluctuating symptoms consistent with IBS.  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 7.7 cm size - on the larger side for these lesions. Nothing to suggest malignancy.  On oral contraceptives.   Hopefully, liver lesion can be managed conservatively. As discussed, patient will hold up on starting a family until F/U imaging performed.  Recommendations:  Hepatic profile today  Keep Appointment for  repeat MRI in February  Trial of Levsin .125 mg - one under the tongue before meals and at bedtime as needed for abdominal symptoms  Further recommendations to follow       Notice: This dictation was prepared with Dragon dictation along with smaller phrase technology. Any transcriptional errors that result from this process are unintentional and may not be corrected upon review.

## 2016-03-01 ENCOUNTER — Telehealth: Payer: Self-pay | Admitting: Internal Medicine

## 2016-03-01 NOTE — Telephone Encounter (Signed)
Letter mailed

## 2016-03-01 NOTE — Telephone Encounter (Signed)
FEB RECALL FOR MRI °

## 2016-03-09 ENCOUNTER — Telehealth: Payer: Self-pay

## 2016-03-09 ENCOUNTER — Other Ambulatory Visit: Payer: Self-pay

## 2016-03-09 DIAGNOSIS — R160 Hepatomegaly, not elsewhere classified: Secondary | ICD-10-CM

## 2016-03-09 NOTE — Telephone Encounter (Signed)
Pt called office to schedule MRI (she received letter in the mail). MRI abdomen with and without contrast scheduled for 04/04/16 at 9:00 am at [facility]. Arrive at 8:45 am. NPO 4 hours prior to test. Called and informed pt. Letter also mailed.

## 2016-03-12 NOTE — Telephone Encounter (Signed)
PA for MRI abdomen with and without contrast submitted via [employer]. Request [place]: In Progress.

## 2016-03-19 NOTE — Telephone Encounter (Signed)
Received fax from [facility], request for MRI abdomen with/without contrast was not approved.   Connie [name], can you please call [phone] to provide additional info.

## 2016-03-20 NOTE — Telephone Encounter (Signed)
Dx used was liver mass.

## 2016-03-20 NOTE — Telephone Encounter (Signed)
What did we use for diagnosis?

## 2016-03-22 NOTE — Telephone Encounter (Signed)
Spoke to Ginger, apparently case went to appeals department and we are waiting call back.

## 2016-03-26 NOTE — Telephone Encounter (Signed)
Clinical notes faxed to Appeals dept (fax: [phone]). Danae Chen (nurse) called and requested notes be faxed to appeals dept.

## 2016-03-27 NOTE — Telephone Encounter (Signed)
[employer] called PA# [id number] for MRI. Called and informed pt it had been approved.

## 2016-03-29 NOTE — Telephone Encounter (Signed)
Thanks

## 2016-04-04 ENCOUNTER — Ambulatory Visit (HOSPITAL_COMMUNITY)
Admission: RE | Admit: 2016-04-04 | Discharge: 2016-04-04 | Disposition: A | Discharge status: Home / Self Care | Payer: BLUE CROSS/BLUE SHIELD | Source: Ambulatory Visit | Attending: Gastroenterology | Admitting: Gastroenterology

## 2016-04-04 ENCOUNTER — Telehealth: Payer: Self-pay

## 2016-04-04 DIAGNOSIS — N281 Cyst of kidney, acquired: Secondary | ICD-10-CM | POA: Insufficient documentation

## 2016-04-04 DIAGNOSIS — K7689 Other specified diseases of liver: Secondary | ICD-10-CM | POA: Diagnosis not present

## 2016-04-04 DIAGNOSIS — R160 Hepatomegaly, not elsewhere classified: Secondary | ICD-10-CM

## 2016-04-04 MED ORDER — GADOXETATE DISODIUM 0.25 MMOL/ML IV SOLN
5.0000 mL | Freq: Once | INTRAVENOUS | Status: AC | PRN
  Administered 2016-04-04: 5 mL via INTRAVENOUS

## 2016-04-04 NOTE — Telephone Encounter (Signed)
Pt called office. She had MRI done this morning and is anxious about results. Informed pt I would let LSL know and she would be contacted.

## 2016-04-05 NOTE — Telephone Encounter (Signed)
Connie [name] spoke with the pt. 

## 2016-04-05 NOTE — Telephone Encounter (Signed)
Please see result note. Essentially stable benign appearing liver mass. I need to discuss further with RMR. Further recommendations to follow. Please inform patient this much today.

## 2016-04-05 NOTE — Progress Notes (Signed)
Please let patient know that I will be discussing MRI findings with Dr. Gala Romney tomorrow but I want her to know results today.  Liver mass still same size, appears to be benign focal nodular hyperplasia. It does push on the gallbladder some though. Further recommendations to follow discussion with Dr. Gala Romney,

## 2016-04-09 ENCOUNTER — Other Ambulatory Visit: Payer: Self-pay

## 2016-04-09 ENCOUNTER — Telehealth: Payer: Self-pay | Admitting: Internal Medicine

## 2016-04-09 DIAGNOSIS — R160 Hepatomegaly, not elsewhere classified: Secondary | ICD-10-CM

## 2016-04-09 NOTE — Telephone Encounter (Signed)
Routing to LSL 

## 2016-04-09 NOTE — Telephone Encounter (Signed)
Pt is set up to see Dr.Clark on 04/16/16 @ 1:00 pm. She is aware and information has been faxed

## 2016-04-09 NOTE — Telephone Encounter (Signed)
I have spoken to Dr. Gala Romney. He wants the patient to see one of the hepatobiliary surgeons at [facility] (Dr. Jyl Heinz, Dr. Franki Cabot, or Dr. Bailey Mech) to get their opinion of whether or not the liver mass needs to come out.  Please make referral to hepatobiliary surgeon at [facility] (Dr. Jyl Heinz, Dr. Birdie Sons, or Dr. Bailey Mech). 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  Send copy of films on CD if you can (I have also instructed patient to go by and get on CD) CT renal stone 10/2015, MRI abd/pelvis 10/2015, MRI abd 03/2016.   Send copy of both OV notes since 10/2015.   DISCUSSED WITH PATIENT WHO IS IN AGREEMENT.

## 2016-04-09 NOTE — Telephone Encounter (Signed)
Ginger, please refer pt

## 2016-04-09 NOTE — Telephone Encounter (Signed)
I tried to call patient at work. Left message with melody to have patient return my call. She was with a customer.

## 2016-04-09 NOTE — Telephone Encounter (Signed)
[phone] PATIENT CALLED INQUIRING ABOUT THE NEXT STEPS IN HER CARE FOLLOWING HER MRI SHE HAD LAST WEEK

## 2016-04-09 NOTE — Progress Notes (Signed)
SEE SEPARATE TELEPHONE NOTE.

## 2016-04-09 NOTE — Telephone Encounter (Signed)
Spoke to patient. Less pp loose stool. Daily nausea, she has had chronically no matter what she eats. No abdominal pain. Advised that I will discuss MRI findings re: liver mass with dr. Gala Romney. Patient is anxious to start a family but wants to make sure it's ok first. Previous discussion with Dr. Gala Romney, ?surgical resection.   Will touch base with patient once Dr. Gala Romney and I have talked.

## 2016-04-16 DIAGNOSIS — K7689 Other specified diseases of liver: Secondary | ICD-10-CM | POA: Insufficient documentation

## 2016-11-19 ENCOUNTER — Telehealth: Payer: Self-pay | Admitting: Internal Medicine

## 2016-11-19 NOTE — Telephone Encounter (Signed)
Spoke with the pt, she said got a letter from [facility] at [facility] and they want her to repeat her MRI now. Pt said she just started a new job and the MRI will cost her $3500.00 and she wanted to know if it was ok to wait until next year to have this done. She is planning to start trying to have a baby after the first of the year and that would help them meet their deductible. She said she has tried to call Dr.Clark and his nurse and left messages for them but has not heard from them.  Dr.Rourk, do you think it is ok for the pt to wait until after the first of the year to have MRI done?  I have printed the [facility] ov note and put it on RMR cart.

## 2016-11-19 NOTE — Telephone Encounter (Signed)
[phone]  Please call patient, has a few questions about a procedure another doctor wants to schedule her for.

## 2016-11-20 NOTE — Telephone Encounter (Signed)
ON RECALL  °

## 2016-11-20 NOTE — Telephone Encounter (Signed)
Not recommended strategy but I'll go along with it. Let's arrange a MRI of the liver 02/27/2017.

## 2016-11-20 NOTE — Telephone Encounter (Signed)
Tried to call pt- NA-LMOM with recommendations. Please nic.

## 2017-01-11 ENCOUNTER — Telehealth: Payer: Self-pay | Admitting: Internal Medicine

## 2017-01-11 ENCOUNTER — Encounter: Payer: Self-pay | Admitting: *Deleted

## 2017-01-11 NOTE — Telephone Encounter (Signed)
Letter mailed to pt.  

## 2017-01-11 NOTE — Telephone Encounter (Signed)
On recall for mri 02/2017

## 2017-01-21 ENCOUNTER — Telehealth: Payer: Self-pay | Admitting: Internal Medicine

## 2017-01-21 DIAGNOSIS — J069 Acute upper respiratory infection, unspecified: Secondary | ICD-10-CM | POA: Diagnosis not present

## 2017-01-21 DIAGNOSIS — J329 Chronic sinusitis, unspecified: Secondary | ICD-10-CM | POA: Diagnosis not present

## 2017-01-21 NOTE — Telephone Encounter (Signed)
Spoke with pt and pt was put on an antibiotic and prednisone for a sinus infection. Pt usually gets some irritation with her stomach while being on antibiotics. Pt wanted to know if it would be good to continue her Align.   Pt notified that a probiotic is good to take while on antibiotics. If she has some problems while taking her antibiotic, pt can call her pcp back.

## 2017-01-21 NOTE — Telephone Encounter (Signed)
Pt called to say she is having her MRI done in January at [facility] and she has a question about taking Align while she's on antibiotics (amoxicillin and prednisone). Please call her at [phone] or [phone]

## 2017-02-07 ENCOUNTER — Telehealth: Payer: Self-pay | Admitting: Internal Medicine

## 2017-02-07 NOTE — Telephone Encounter (Signed)
RECALL FOR MRI LIVER °

## 2017-02-07 NOTE — Telephone Encounter (Signed)
See phone note dated 01/21/17. Patient is having MRI Liver done at [facility] in January.

## 2017-03-06 ENCOUNTER — Other Ambulatory Visit: Payer: Self-pay | Admitting: Dermatology

## 2017-03-06 DIAGNOSIS — D492 Neoplasm of unspecified behavior of bone, soft tissue, and skin: Secondary | ICD-10-CM | POA: Diagnosis not present

## 2017-03-06 DIAGNOSIS — D229 Melanocytic nevi, unspecified: Secondary | ICD-10-CM | POA: Diagnosis not present

## 2017-03-06 DIAGNOSIS — L858 Other specified epidermal thickening: Secondary | ICD-10-CM | POA: Diagnosis not present

## 2017-03-26 DIAGNOSIS — Z6822 Body mass index (BMI) 22.0-22.9, adult: Secondary | ICD-10-CM | POA: Diagnosis not present

## 2017-03-26 DIAGNOSIS — Z0184 Encounter for antibody response examination: Secondary | ICD-10-CM | POA: Diagnosis not present

## 2017-03-26 DIAGNOSIS — Z01419 Encounter for gynecological examination (general) (routine) without abnormal findings: Secondary | ICD-10-CM | POA: Diagnosis not present

## 2017-03-28 DIAGNOSIS — K7689 Other specified diseases of liver: Secondary | ICD-10-CM | POA: Diagnosis not present

## 2017-09-03 DIAGNOSIS — Z6822 Body mass index (BMI) 22.0-22.9, adult: Secondary | ICD-10-CM | POA: Diagnosis not present

## 2017-09-03 DIAGNOSIS — Z3201 Encounter for pregnancy test, result positive: Secondary | ICD-10-CM | POA: Diagnosis not present

## 2017-09-03 DIAGNOSIS — N925 Other specified irregular menstruation: Secondary | ICD-10-CM | POA: Diagnosis not present

## 2017-09-03 DIAGNOSIS — Z1389 Encounter for screening for other disorder: Secondary | ICD-10-CM | POA: Diagnosis not present

## 2017-09-19 DIAGNOSIS — N911 Secondary amenorrhea: Secondary | ICD-10-CM | POA: Diagnosis not present

## 2017-10-07 DIAGNOSIS — Z13228 Encounter for screening for other metabolic disorders: Secondary | ICD-10-CM | POA: Diagnosis not present

## 2017-10-07 DIAGNOSIS — Z3401 Encounter for supervision of normal first pregnancy, first trimester: Secondary | ICD-10-CM | POA: Diagnosis not present

## 2017-10-07 DIAGNOSIS — Z3685 Encounter for antenatal screening for Streptococcus B: Secondary | ICD-10-CM | POA: Diagnosis not present

## 2017-10-07 LAB — OB RESULTS CONSOLE RUBELLA ANTIBODY, IGM: Rubella: IMMUNE

## 2017-10-07 LAB — OB RESULTS CONSOLE RPR: RPR: NONREACTIVE

## 2017-10-07 LAB — OB RESULTS CONSOLE HEPATITIS B SURFACE ANTIGEN: Hepatitis B Surface Ag: NEGATIVE

## 2017-10-07 LAB — OB RESULTS CONSOLE ABO/RH: RH Type: POSITIVE

## 2017-10-07 LAB — OB RESULTS CONSOLE GC/CHLAMYDIA: Gonorrhea: NEGATIVE

## 2017-10-07 LAB — OB RESULTS CONSOLE HIV ANTIBODY (ROUTINE TESTING): HIV: NONREACTIVE

## 2017-10-16 DIAGNOSIS — Z331 Pregnant state, incidental: Secondary | ICD-10-CM | POA: Diagnosis not present

## 2017-10-16 DIAGNOSIS — Z124 Encounter for screening for malignant neoplasm of cervix: Secondary | ICD-10-CM | POA: Diagnosis not present

## 2017-10-16 DIAGNOSIS — Z113 Encounter for screening for infections with a predominantly sexual mode of transmission: Secondary | ICD-10-CM | POA: Diagnosis not present

## 2017-10-16 DIAGNOSIS — Z3400 Encounter for supervision of normal first pregnancy, unspecified trimester: Secondary | ICD-10-CM | POA: Diagnosis not present

## 2017-10-31 DIAGNOSIS — Z1379 Encounter for other screening for genetic and chromosomal anomalies: Secondary | ICD-10-CM | POA: Diagnosis not present

## 2017-10-31 DIAGNOSIS — Z3682 Encounter for antenatal screening for nuchal translucency: Secondary | ICD-10-CM | POA: Diagnosis not present

## 2017-11-13 DIAGNOSIS — O26612 Liver and biliary tract disorders in pregnancy, second trimester: Secondary | ICD-10-CM | POA: Diagnosis not present

## 2017-11-13 DIAGNOSIS — K7689 Other specified diseases of liver: Secondary | ICD-10-CM | POA: Diagnosis not present

## 2017-11-13 DIAGNOSIS — Z3A14 14 weeks gestation of pregnancy: Secondary | ICD-10-CM | POA: Diagnosis not present

## 2017-12-12 DIAGNOSIS — Z3A18 18 weeks gestation of pregnancy: Secondary | ICD-10-CM | POA: Diagnosis not present

## 2017-12-12 DIAGNOSIS — Z3480 Encounter for supervision of other normal pregnancy, unspecified trimester: Secondary | ICD-10-CM | POA: Diagnosis not present

## 2017-12-12 DIAGNOSIS — Z363 Encounter for antenatal screening for malformations: Secondary | ICD-10-CM | POA: Diagnosis not present

## 2018-01-13 DIAGNOSIS — Z23 Encounter for immunization: Secondary | ICD-10-CM | POA: Diagnosis not present

## 2018-02-10 DIAGNOSIS — Z3480 Encounter for supervision of other normal pregnancy, unspecified trimester: Secondary | ICD-10-CM | POA: Diagnosis not present

## 2018-02-10 DIAGNOSIS — Z23 Encounter for immunization: Secondary | ICD-10-CM | POA: Diagnosis not present

## 2018-02-23 IMAGING — US US PELVIS COMPLETE
1 series · 13 of 25 positions shown · non-contrast
Comparison: None.

CLINICAL DATA: Pelvic pain for 2 weeks.  LMP 11/14/2015.

EXAM:
TRANSABDOMINAL AND TRANSVAGINAL ULTRASOUND OF PELVIS
DOPPLER ULTRASOUND OF OVARIES
TECHNIQUE: Both transabdominal and transvaginal ultrasound examinations of the
pelvis were performed. Transabdominal technique was performed for
global imaging of the pelvis including uterus, ovaries, adnexal
regions, and pelvic cul-de-sac.
It was necessary to proceed with endovaginal exam following the
transabdominal exam to visualize the endometrium and ovaries. Color
and duplex Doppler ultrasound was utilized to evaluate blood flow to
the ovaries.

[Series 1: us pelvis complete · 0.24mm/px · 13 of 108 slices shown]
[im 1/108]
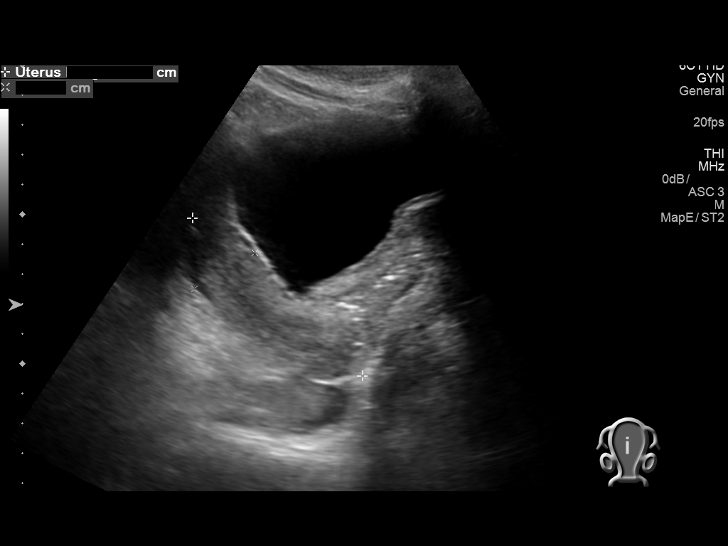
[im 9/108]
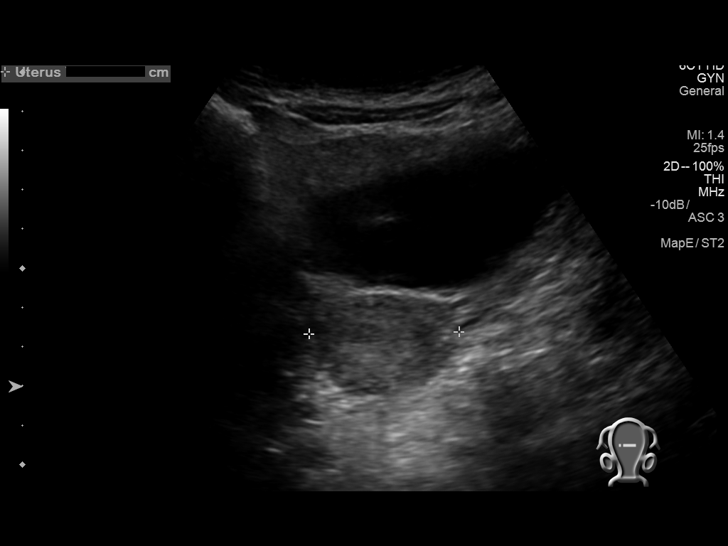
[im 18/108]
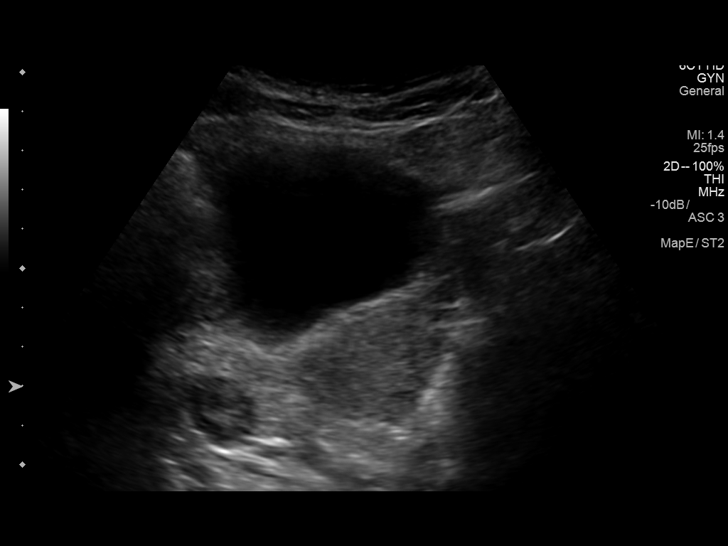
[im 27/108]
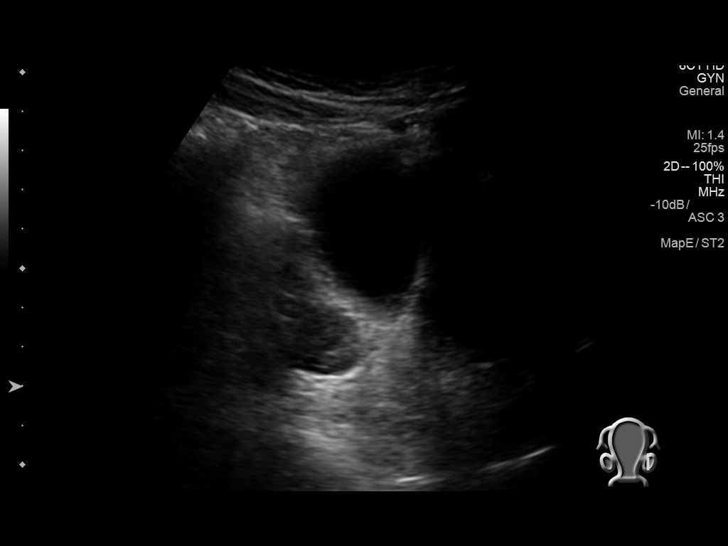
[im 36/108]
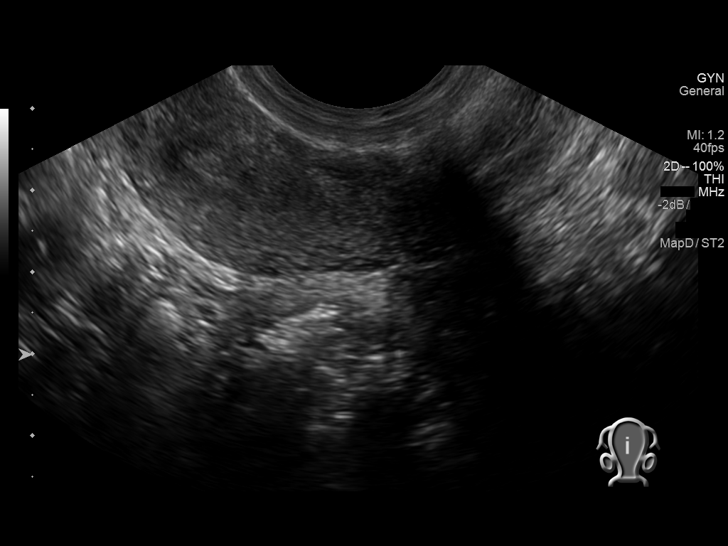
[im 45/108]
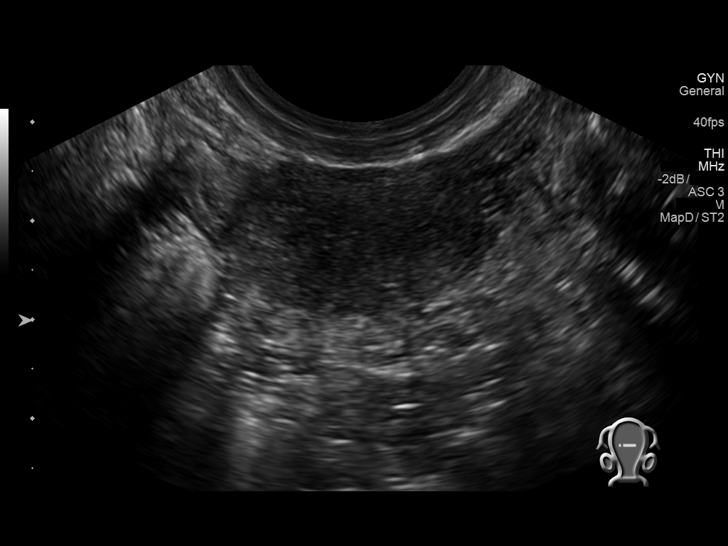
[im 54/108]
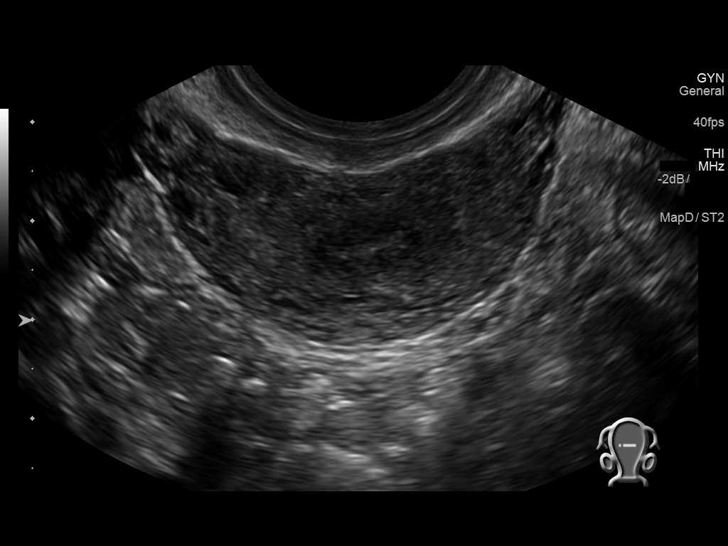
[im 63/108]
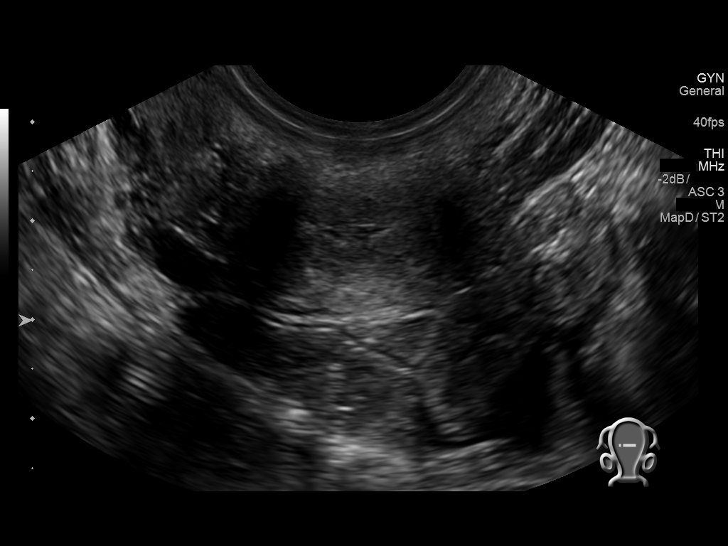
[im 72/108]
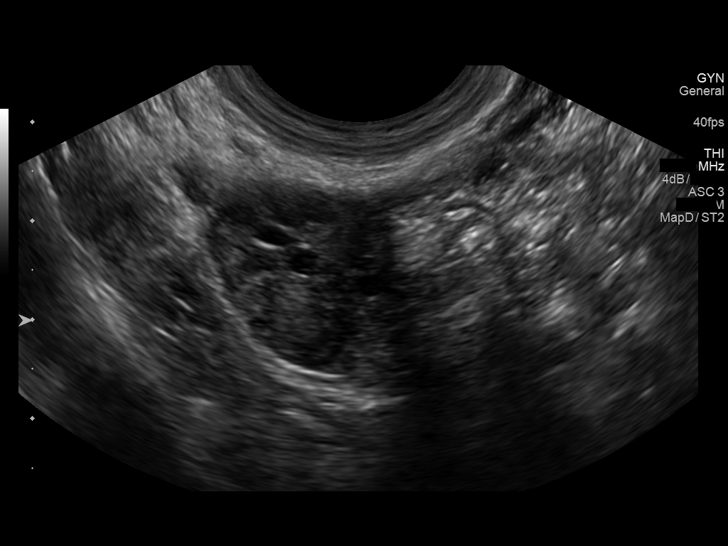
[im 81/108]
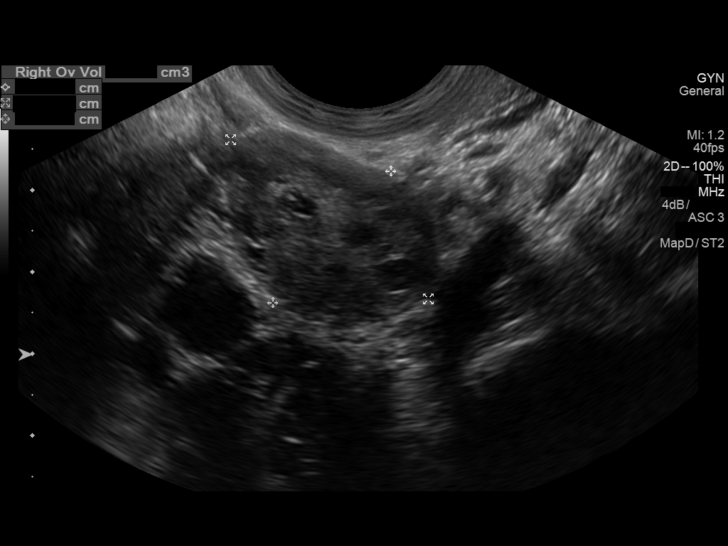
[im 90/108]
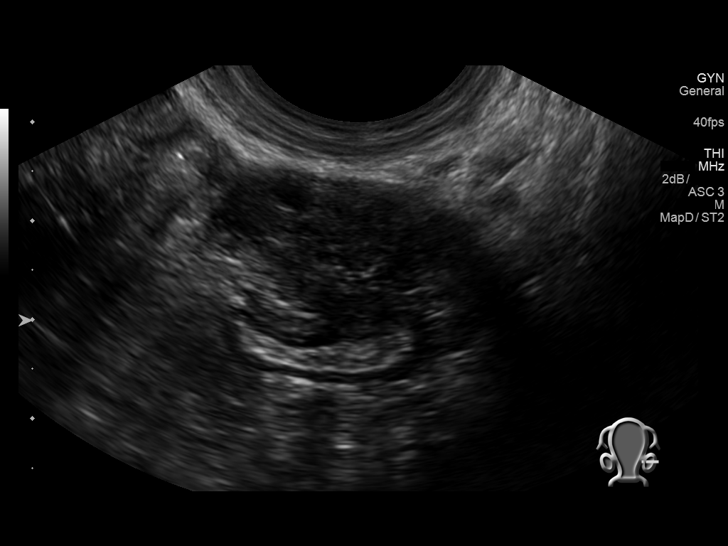
[im 99/108]
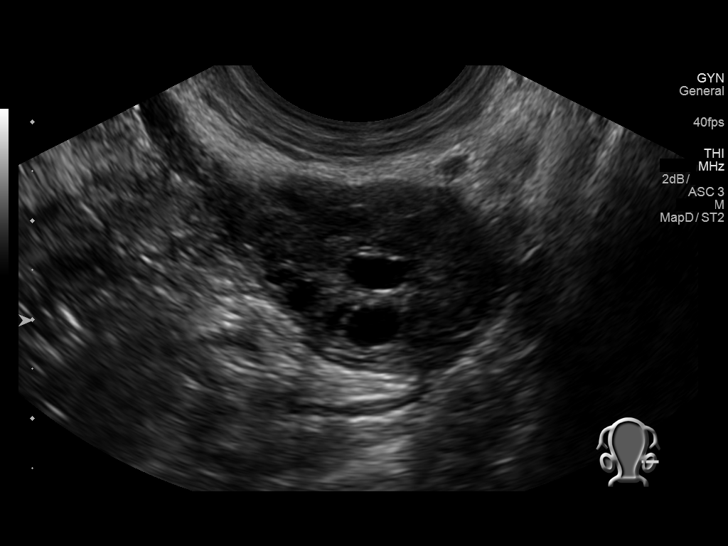
[im 108/108]
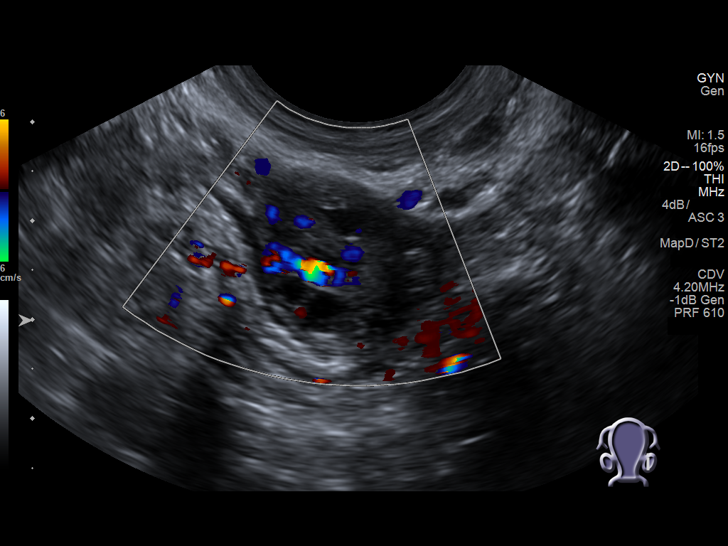

[13 of 25 positions shown; findings below may reference images not displayed]

FINDINGS: Uterus

Measurements: 6.4 x 2.3 x 3.5 cm. No fibroids or other mass
visualized.

Endometrium

Thickness: 3 mm.  No focal abnormality visualized.

Right ovary

Measurements: 3.1 x 2.2 x 2.2 cm. Normal appearance/no adnexal mass.

Left ovary

Measurements: 2.6 x 1.8 x 2.8 cm. Normal appearance/no adnexal mass.

Pulsed Doppler evaluation of both ovaries demonstrates normal
low-resistance arterial and venous waveforms.

Other findings

No abnormal free fluid.
IMPRESSION: Normal appearance of uterus and ovaries. No pelvic mass or other
sonographic abnormality demonstrated.

No sonographic evidence for ovarian torsion.

## 2018-02-26 NOTE — L&D Delivery Note (Signed)
Delivery Note At 12:30 PM a viable female was delivered via  (Presentation: LOA).  APGAR: 5, 7; weight pending.   Placenta status: S, I and sent to path. 3V Cord with the following complications: nuchal x 2 and body cord.  Cord pH: n/a  Anesthesia:  CLEA Episiotomy:  N/a Lacerations:  vaginal Suture Repair: 3.0 vicryl rapide Est. Blood Loss (mL):  150  Mom to postpartum.  Baby to Couplet care / Skin to Skin.  Linda Hedges 05/04/2018, 1:02 PM

## 2018-04-16 DIAGNOSIS — Z3685 Encounter for antenatal screening for Streptococcus B: Secondary | ICD-10-CM | POA: Diagnosis not present

## 2018-04-16 DIAGNOSIS — Z3480 Encounter for supervision of other normal pregnancy, unspecified trimester: Secondary | ICD-10-CM | POA: Diagnosis not present

## 2018-04-21 LAB — OB RESULTS CONSOLE GBS: GBS: NEGATIVE

## 2018-05-03 ENCOUNTER — Inpatient Hospital Stay (HOSPITAL_COMMUNITY)
Admission: AD | Admit: 2018-05-03 | Discharge: 2018-05-05 | DRG: 807 | Disposition: A | Discharge status: Home / Self Care | Payer: BLUE CROSS/BLUE SHIELD | Attending: Obstetrics & Gynecology | Admitting: Obstetrics & Gynecology

## 2018-05-03 ENCOUNTER — Encounter (HOSPITAL_COMMUNITY): Payer: Self-pay

## 2018-05-03 ENCOUNTER — Other Ambulatory Visit: Payer: Self-pay

## 2018-05-03 DIAGNOSIS — Z3A38 38 weeks gestation of pregnancy: Secondary | ICD-10-CM

## 2018-05-03 DIAGNOSIS — Z3A00 Weeks of gestation of pregnancy not specified: Secondary | ICD-10-CM | POA: Diagnosis not present

## 2018-05-03 DIAGNOSIS — O4292 Full-term premature rupture of membranes, unspecified as to length of time between rupture and onset of labor: Principal | ICD-10-CM | POA: Diagnosis present

## 2018-05-03 DIAGNOSIS — Z3490 Encounter for supervision of normal pregnancy, unspecified, unspecified trimester: Secondary | ICD-10-CM

## 2018-05-03 LAB — CBC
HCT: 38.9 % (ref 36.0–46.0)
Hemoglobin: 13.2 g/dL (ref 12.0–15.0)
MCH: 29.9 pg (ref 26.0–34.0)
MCHC: 33.9 g/dL (ref 30.0–36.0)
MCV: 88 fL (ref 80.0–100.0)
Platelets: 242 10*3/uL (ref 150–400)
RBC: 4.42 MIL/uL (ref 3.87–5.11)
RDW: 12.5 % (ref 11.5–15.5)
WBC: 9.9 10*3/uL (ref 4.0–10.5)
nRBC: 0 % (ref 0.0–0.2)

## 2018-05-03 LAB — TYPE AND SCREEN
ABO/RH(D): O POS
Antibody Screen: NEGATIVE

## 2018-05-03 LAB — POCT FERN TEST: POCT Fern Test: POSITIVE

## 2018-05-03 MED ORDER — FLEET ENEMA 7-19 GM/118ML RE ENEM: 1.0000 | ENEMA | Freq: Once | RECTAL | Status: DC

## 2018-05-03 MED ORDER — ACETAMINOPHEN 325 MG PO TABS: 650.0000 mg | ORAL_TABLET | ORAL | Status: DC | PRN

## 2018-05-03 MED ORDER — LACTATED RINGERS IV SOLN
INTRAVENOUS | Status: DC
  Administered 2018-05-03: 20:00:00 via INTRAVENOUS

## 2018-05-03 MED ORDER — LACTATED RINGERS IV SOLN: 500.0000 mL | INTRAVENOUS | Status: DC | PRN

## 2018-05-03 MED ORDER — ONDANSETRON HCL 4 MG/2ML IJ SOLN
4.0000 mg | Freq: Four times a day (QID) | INTRAMUSCULAR | Status: DC | PRN
  Administered 2018-05-04: 4 mg via INTRAVENOUS
  Filled 2018-05-03: qty 2

## 2018-05-03 MED ORDER — ZOLPIDEM TARTRATE 5 MG PO TABS
5.0000 mg | ORAL_TABLET | Freq: Every day | ORAL | Status: DC
  Administered 2018-05-04: 5 mg via ORAL
  Filled 2018-05-03: qty 1

## 2018-05-03 MED ORDER — MISOPROSTOL 50MCG HALF TABLET
50.0000 ug | ORAL_TABLET | ORAL | Status: DC | PRN
  Administered 2018-05-03: 50 ug via ORAL
  Filled 2018-05-03 (×2): qty 1

## 2018-05-03 MED ORDER — OXYCODONE-ACETAMINOPHEN 5-325 MG PO TABS: 2.0000 | ORAL_TABLET | ORAL | Status: DC | PRN

## 2018-05-03 MED ORDER — OXYCODONE-ACETAMINOPHEN 5-325 MG PO TABS: 1.0000 | ORAL_TABLET | ORAL | Status: DC | PRN

## 2018-05-03 MED ORDER — OXYTOCIN BOLUS FROM INFUSION: 500.0000 mL | Freq: Once | INTRAVENOUS | Status: DC

## 2018-05-03 MED ORDER — FENTANYL CITRATE (PF) 100 MCG/2ML IJ SOLN: 50.0000 ug | INTRAMUSCULAR | Status: DC | PRN

## 2018-05-03 MED ORDER — TERBUTALINE SULFATE 1 MG/ML IJ SOLN: 0.2500 mg | Freq: Once | INTRAMUSCULAR | Status: DC | PRN

## 2018-05-03 MED ORDER — OXYTOCIN 40 UNITS IN NORMAL SALINE INFUSION - SIMPLE MED: 2.5000 [IU]/h | INTRAVENOUS | Status: DC

## 2018-05-03 MED ORDER — LIDOCAINE HCL (PF) 1 % IJ SOLN
30.0000 mL | INTRAMUSCULAR | Status: DC | PRN
  Filled 2018-05-03: qty 30

## 2018-05-03 MED ORDER — SOD CITRATE-CITRIC ACID 500-334 MG/5ML PO SOLN: 30.0000 mL | ORAL | Status: DC | PRN

## 2018-05-03 NOTE — MAU Note (Signed)
Connie [name] is a 28 y.o. at [redacted]w[redacted]d here in MAU reporting: reports LOF around 1600, states she has had several gushes and is wearing a pad. Fluid is clear. No bleeding. Having some cramping. +FM  Onset of complaint: this afternoon around 1600  Pain score: 5/10  Vitals:   05/03/18 1830  BP: (!) 128/95  Pulse: 95  Resp: 18  Temp: (!) 97.3 F (36.3 C)  SpO2: 100%      FHT: +FM  Lab orders placed from triage: none

## 2018-05-03 NOTE — H&P (Signed)
Connie [name] is a 28 y.o. female presenting for PROM at 1600 today.  She reports mild CTX just starting.  No VB.  Active FM.  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  Patient had normal NIPS.  GBS negative.  OB History    Gravida  1   Para      Term      Preterm      AB      Living        SAB      TAB      Ectopic      Multiple      Live Births             Past Medical History:  Diagnosis Date  . Hypoglycemic reaction   . IBS (irritable bowel syndrome)    Past Surgical History:  Procedure Laterality Date  . NO PAST SURGERIES    . WISDOM TOOTH EXTRACTION     Family History: family history includes Heart attack in an other family member; Hypertension in her mother. Social History:  reports that she has never smoked. She has never used smokeless tobacco. She reports that she does not drink alcohol or use drugs.     Maternal Diabetes: No Genetic Screening: Normal Maternal Ultrasounds/Referrals: Normal Fetal Ultrasounds or other Referrals:  None Maternal Substance Abuse:  No Significant Maternal Medications:  None Significant Maternal Lab Results:  Lab values include: Group B Strep negative Other Comments:  None  ROS Maternal Medical History:  Reason for admission: Rupture of membranes.   Contractions: Onset was 1-2 hours ago.   Frequency: irregular.   Perceived severity is mild.    Fetal activity: Perceived fetal activity is normal.   Last perceived fetal movement was within the past hour.    Prenatal complications: no prenatal complications Prenatal Complications - Diabetes: none.    Dilation: Closed Effacement (%): Thick Station: Ballotable Exam by:: Glenice Bow, rnc  Blood pressure 123/85, pulse 92, temperature (!) 97.5 F (36.4 C), temperature source Oral, resp. rate 16, height 5\' 2"  (1.575 m), weight 71.4 kg, SpO2 100 %. Maternal  Exam:  Uterine Assessment: Contraction strength is mild.  Contraction frequency is irregular.   Abdomen: Patient reports no abdominal tenderness. Fundal height is c/w dates.   Estimated fetal weight is 7#4.       Physical Exam  Constitutional: She is oriented to person, place, and time. She appears well-developed and well-nourished.  GI: Soft. There is no rebound and no guarding.  Neurological: She is alert and oriented to person, place, and time.  Skin: Skin is warm and dry.  Psychiatric: She has a normal mood and affect. Her behavior is normal.    Prenatal labs: ABO, Rh: --/--/PENDING (03/07 1935) Antibody: PENDING (03/07 1935) Rubella:   RPR:    HBsAg:    HIV:    GBS:     Assessment/Plan: 27yo G1 at 38w with PROM -OMP -CLEA when desired -Anticipate NSVD  Linda Hedges 05/03/2018, 9:09 PM

## 2018-05-04 ENCOUNTER — Inpatient Hospital Stay (HOSPITAL_COMMUNITY): Payer: BLUE CROSS/BLUE SHIELD | Admitting: Anesthesiology

## 2018-05-04 ENCOUNTER — Encounter (HOSPITAL_COMMUNITY): Payer: Self-pay | Admitting: Anesthesiology

## 2018-05-04 LAB — RPR: RPR Ser Ql: NONREACTIVE

## 2018-05-04 LAB — ABO/RH: ABO/RH(D): O POS

## 2018-05-04 MED ORDER — DIPHENHYDRAMINE HCL 50 MG/ML IJ SOLN: 12.5000 mg | INTRAMUSCULAR | Status: DC | PRN

## 2018-05-04 MED ORDER — BENZOCAINE-MENTHOL 20-0.5 % EX AERO
1.0000 "application " | INHALATION_SPRAY | CUTANEOUS | Status: DC | PRN
  Administered 2018-05-04 – 2018-05-05 (×2): 1 via TOPICAL
  Filled 2018-05-04 (×2): qty 56

## 2018-05-04 MED ORDER — OXYCODONE-ACETAMINOPHEN 5-325 MG PO TABS: 1.0000 | ORAL_TABLET | ORAL | Status: DC | PRN

## 2018-05-04 MED ORDER — FENTANYL-BUPIVACAINE-NACL 0.5-0.125-0.9 MG/250ML-% EP SOLN
EPIDURAL | Status: AC
  Filled 2018-05-04: qty 250

## 2018-05-04 MED ORDER — SENNOSIDES-DOCUSATE SODIUM 8.6-50 MG PO TABS
2.0000 | ORAL_TABLET | ORAL | Status: DC
  Administered 2018-05-04: 2 via ORAL
  Filled 2018-05-04: qty 2

## 2018-05-04 MED ORDER — OXYCODONE-ACETAMINOPHEN 5-325 MG PO TABS: 2.0000 | ORAL_TABLET | ORAL | Status: DC | PRN

## 2018-05-04 MED ORDER — ONDANSETRON HCL 4 MG PO TABS: 4.0000 mg | ORAL_TABLET | ORAL | Status: DC | PRN

## 2018-05-04 MED ORDER — ACETAMINOPHEN 325 MG PO TABS: 650.0000 mg | ORAL_TABLET | ORAL | Status: DC | PRN

## 2018-05-04 MED ORDER — SIMETHICONE 80 MG PO CHEW: 80.0000 mg | CHEWABLE_TABLET | ORAL | Status: DC | PRN

## 2018-05-04 MED ORDER — PRENATAL MULTIVITAMIN CH
1.0000 | ORAL_TABLET | Freq: Every day | ORAL | Status: DC
  Administered 2018-05-05: 1 via ORAL
  Filled 2018-05-04: qty 1

## 2018-05-04 MED ORDER — WITCH HAZEL-GLYCERIN EX PADS: 1.0000 "application " | MEDICATED_PAD | CUTANEOUS | Status: DC | PRN

## 2018-05-04 MED ORDER — OXYTOCIN 40 UNITS IN NORMAL SALINE INFUSION - SIMPLE MED
1.0000 m[IU]/min | INTRAVENOUS | Status: DC
  Administered 2018-05-04: 2 m[IU]/min via INTRAVENOUS
  Filled 2018-05-04: qty 1000

## 2018-05-04 MED ORDER — DIPHENHYDRAMINE HCL 25 MG PO CAPS: 25.0000 mg | ORAL_CAPSULE | Freq: Four times a day (QID) | ORAL | Status: DC | PRN

## 2018-05-04 MED ORDER — LACTATED RINGERS IV SOLN: 500.0000 mL | Freq: Once | INTRAVENOUS | Status: DC

## 2018-05-04 MED ORDER — ONDANSETRON HCL 4 MG/2ML IJ SOLN: 4.0000 mg | INTRAMUSCULAR | Status: DC | PRN

## 2018-05-04 MED ORDER — EPHEDRINE 5 MG/ML INJ: 10.0000 mg | INTRAVENOUS | Status: DC | PRN

## 2018-05-04 MED ORDER — IBUPROFEN 600 MG PO TABS
600.0000 mg | ORAL_TABLET | Freq: Four times a day (QID) | ORAL | Status: DC
  Administered 2018-05-04 – 2018-05-05 (×5): 600 mg via ORAL
  Filled 2018-05-04 (×5): qty 1

## 2018-05-04 MED ORDER — PHENYLEPHRINE 40 MCG/ML (10ML) SYRINGE FOR IV PUSH (FOR BLOOD PRESSURE SUPPORT): 80.0000 ug | PREFILLED_SYRINGE | INTRAVENOUS | Status: DC | PRN

## 2018-05-04 MED ORDER — SODIUM CHLORIDE (PF) 0.9 % IJ SOLN
INTRAMUSCULAR | Status: DC | PRN
  Administered 2018-05-04: 12 mL/h via EPIDURAL

## 2018-05-04 MED ORDER — DIBUCAINE 1 % RE OINT: 1.0000 "application " | TOPICAL_OINTMENT | RECTAL | Status: DC | PRN

## 2018-05-04 MED ORDER — PHENYLEPHRINE 40 MCG/ML (10ML) SYRINGE FOR IV PUSH (FOR BLOOD PRESSURE SUPPORT)
PREFILLED_SYRINGE | INTRAVENOUS | Status: AC
  Filled 2018-05-04: qty 10

## 2018-05-04 MED ORDER — LIDOCAINE HCL (PF) 1 % IJ SOLN
INTRAMUSCULAR | Status: DC | PRN
  Administered 2018-05-04 (×2): 6 mL via EPIDURAL

## 2018-05-04 MED ORDER — TETANUS-DIPHTH-ACELL PERTUSSIS 5-2.5-18.5 LF-MCG/0.5 IM SUSP: 0.5000 mL | Freq: Once | INTRAMUSCULAR | Status: DC

## 2018-05-04 MED ORDER — COCONUT OIL OIL: 1.0000 "application " | TOPICAL_OIL | Status: DC | PRN

## 2018-05-04 MED ORDER — FENTANYL-BUPIVACAINE-NACL 0.5-0.125-0.9 MG/250ML-% EP SOLN: 12.0000 mL/h | EPIDURAL | Status: DC | PRN

## 2018-05-04 MED ORDER — ZOLPIDEM TARTRATE 5 MG PO TABS: 5.0000 mg | ORAL_TABLET | Freq: Every evening | ORAL | Status: DC | PRN

## 2018-05-04 NOTE — Anesthesia Postprocedure Evaluation (Signed)
Anesthesia Post Note  Patient: Connie [name]  Procedure(s) Performed: AN AD [place]     Patient location during evaluation: Mother Baby Anesthesia Type: Epidural Level of consciousness: awake, awake and alert and oriented Pain management: pain level controlled Vital Signs Assessment: post-procedure vital signs reviewed and stable Respiratory status: spontaneous breathing, nonlabored ventilation, respiratory function stable and patient connected to nasal cannula oxygen Cardiovascular status: blood pressure returned to baseline and stable Postop Assessment: no headache, no backache, epidural receding, patient able to bend at knees, no apparent nausea or vomiting, able to ambulate and adequate PO intake Anesthetic complications: no    Last Vitals:  Vitals:   05/04/18 1436 05/04/18 1600  BP: 121/79 126/79  Pulse: (!) 101 (!) 109  Resp: 16 18  Temp: 36.7 C 36.8 C  SpO2:      Last Pain:  Vitals:   05/04/18 1629  TempSrc:   PainSc: 3    Pain Goal:                   Stefani Dama

## 2018-05-04 NOTE — Anesthesia Procedure Notes (Addendum)
Epidural Patient location during procedure: OB Start time: 05/04/2018 2:10 PM End time: 05/04/2018 2:14 PM  Staffing Anesthesiologist: Lyn Hollingshead, MD Performed: anesthesiologist   Preanesthetic Checklist Completed: patient identified, site marked, surgical consent, pre-op evaluation, timeout performed, IV checked, risks and benefits discussed and monitors and equipment checked  Epidural Patient position: sitting Prep: site prepped and draped and DuraPrep Patient monitoring: continuous pulse ox and blood pressure Approach: midline Location: L3-L4 Injection technique: LOR air  Needle:  Needle type: Tuohy  Needle gauge: 17 G Needle length: 9 cm and 9 Needle insertion depth: 6 cm Catheter type: closed end flexible Catheter size: 19 Gauge Catheter at skin depth: 11 cm Test dose: negative and Other  Assessment Sensory level: T9 Events: blood not aspirated, injection not painful, no injection resistance, negative IV test and no paresthesia  Additional Notes Reason for block:procedure for pain

## 2018-05-04 NOTE — Lactation Note (Signed)
This note was copied from a baby's chart. Lactation Consultation Note  Patient Name: Boy Malayjah Ruggerio Today's Date: 05/04/2018 Reason for consult: Initial assessment;Primapara;1st time breastfeeding;Early term 28-38.6wks  6 hours old early term female who is being exclusively BF by his mother, she's a P28. RN Suzie called for [facility] assistance because baby was having a difficult latch, he's got a short frenulum. LC showed mom how to hand express, she voiced that she's getting more colostrum out of her right breast but baby is latching on better from the left one. When [facility] assisted with hand expression more drops were noticed on the right side. Mom has a Medela DEBP at home.  Baby doing STS with mom when entering the room; blood sugar is 39, offered assistance with latch and took baby STS to mom's right breast first in cross cradle position but baby kept fighting the breast, he was able to stay on for about a minute and then unlatched, LC latched him again, and he unlatched again. Repeat on the opposite breast with the same results, baby has some recession on his chin. Asked mom to try the football position instead, and this time, even though he pushed himself off the breast and required constant repositioning he was able to sustain the latch with several audible swallows heard throughout the 14 minutes of the feeding. Baby still nursing when exiting the room.  Discussed normal newborn behavior, feeding cues and cluster feeding. Asked mom how does she feel about pumping early on at the hospital and she voiced that she would like to bring her DEBP from home to pump here instead of using a hospital grade pump. Let mom know our hospital policy about medical devices brought from outside and she understood the limitations of staff in helping her set up her pump in case staff is not familiar with it; mom voiced understanding.  Feeding plan:  1. Encouraged mom to feed baby STS 8-12 times/24 hours or sooner if feeding  cues are present 2. Hand expression and spoon feeding was strongly encouraged  3. Mom will have someone bring her DEBP from home and she'll start pumping tomorrow  BF brochure and feeding diary were reviewed. Parents reported all questions and concerns were answered, they're both aware of [place] services and will call PRN.  Maternal Data Formula Feeding for Exclusion: No Has patient been taught Hand Expression?: Yes Does the patient have breastfeeding experience prior to this delivery?: No  Feeding Feeding Type: Breast Fed  LATCH Score Latch: Repeated attempts needed to sustain latch, nipple held in mouth throughout feeding, stimulation needed to elicit sucking reflex.  Audible Swallowing: A few with stimulation(with breast compressions)  Type of Nipple: Everted at rest and after stimulation  Comfort (Breast/Nipple): Soft / non-tender  Hold (Positioning): Assistance needed to correctly position infant at breast and maintain latch.  LATCH Score: 7  Interventions Interventions: Breast feeding basics reviewed;Assisted with latch;Skin to skin;Breast massage;Adjust position;Support pillows;Hand express;Breast compression;Position options  Lactation Tools Discussed/Used WIC Program: No   Consult Status Consult Status: Follow-up Date: 05/05/18 Follow-up type: In-patient    Renesmee Raine Francene Boyers 05/04/2018, 7:20 PM

## 2018-05-04 NOTE — Anesthesia Preprocedure Evaluation (Signed)
Anesthesia Evaluation  Patient identified by MRN, date of birth, ID band Patient awake    Reviewed: Allergy & Precautions, H&P , NPO status , Patient's Chart, lab work & pertinent test results  Airway Mallampati: II  TM Distance: >3 FB Neck ROM: full    Dental no notable dental hx. (+) Teeth Intact   Pulmonary neg pulmonary ROS,    Pulmonary exam normal breath sounds clear to auscultation       Cardiovascular negative cardio ROS Normal cardiovascular exam Rhythm:regular Rate:Normal     Neuro/Psych negative neurological ROS  negative psych ROS   GI/Hepatic negative GI ROS, Neg liver ROS,   Endo/Other  negative endocrine ROS  Renal/GU negative Renal ROS  negative genitourinary   Musculoskeletal negative musculoskeletal ROS (+)   Abdominal Normal abdominal exam  (+)   Peds  Hematology negative hematology ROS (+)   Anesthesia Other Findings   Reproductive/Obstetrics (+) Pregnancy                             Anesthesia Physical Anesthesia Plan  ASA: II  Anesthesia Plan: Epidural   Post-op Pain Management:    Induction:   PONV Risk Score and Plan:   Airway Management Planned:   Additional Equipment:   Intra-op Plan:   Post-operative Plan:   Informed Consent: I have reviewed the patients History and Physical, chart, labs and discussed the procedure including the risks, benefits and alternatives for the proposed anesthesia with the patient or authorized representative who has indicated his/her understanding and acceptance.       Plan Discussed with:   Anesthesia Plan Comments:         Anesthesia Quick Evaluation

## 2018-05-04 NOTE — Progress Notes (Signed)
Connie [name] is a 28 y.o. G1P0 at [redacted]w[redacted]d by ultrasound admitted for rupture of membranes  Subjective: Comfortable with epidural.  Objective: BP 109/62   Pulse (!) 109   Temp (!) 97.5 F (36.4 C) (Oral)   Resp 18   Ht 5\' 2"  (1.575 m)   Wt 71.4 kg   SpO2 100%   BMI 28.79 kg/m  No intake/output data recorded. No intake/output data recorded.  FHT:  FHR: 145 bpm, variability: moderate,  accelerations:  Present,  decelerations:  Absent UC:   irregular, every 1-4 minutes SVE:   Dilation: 1.5 Effacement (%): 50 Station: -2 Exam by:: S Willis RN  Labs: Lab Results  Component Value Date   WBC 9.9 05/03/2018   HGB 13.2 05/03/2018   HCT 38.9 05/03/2018   MCV 88.0 05/03/2018   PLT 242 05/03/2018    Assessment / Plan: Augmentation of labor, progressing well  Labor: Progressing normally Preeclampsia:  n/a Fetal Wellbeing:  Category I Pain Control:  Epidural I/D:  n/a Anticipated MOD:  NSVD  Linda Hedges 05/04/2018, 6:58 AM

## 2018-05-04 NOTE — Anesthesia Pain Management Evaluation Note (Signed)
  CRNA Pain Management Visit Note  Patient: Connie [name], 27 y.o., female  "Hello I am a member of the anesthesia team at [facility] [employer]. We have an anesthesia team available at all times to provide care throughout the hospital, including epidural management and anesthesia for C-section. I don't know your plan for the delivery whether it a natural birth, water birth, IV sedation, nitrous supplementation, doula or epidural, but we want to meet your pain goals."   1.Was your pain managed to your expectations on prior hospitalizations?   No prior hospitalizations  2.What is your expectation for pain management during this hospitalization?     Epidural  3.How can we help you reach that goal? Maintain epidural until delivery of infant.  Record the patient's initial score and the patient's pain goal.   Pain: 0  Pain Goal: 0 [facility] [place] wants you to be able to say your pain was always managed very well.  Olin Gurski 05/04/2018

## 2018-05-05 LAB — CBC
HCT: 32.7 % — ABNORMAL LOW (ref 36.0–46.0)
Hemoglobin: 11.2 g/dL — ABNORMAL LOW (ref 12.0–15.0)
MCH: 30.3 pg (ref 26.0–34.0)
MCHC: 34.3 g/dL (ref 30.0–36.0)
MCV: 88.4 fL (ref 80.0–100.0)
Platelets: 193 10*3/uL (ref 150–400)
RBC: 3.7 MIL/uL — ABNORMAL LOW (ref 3.87–5.11)
RDW: 12.9 % (ref 11.5–15.5)
WBC: 16.6 10*3/uL — ABNORMAL HIGH (ref 4.0–10.5)
nRBC: 0 % (ref 0.0–0.2)

## 2018-05-05 NOTE — Progress Notes (Signed)
Patient doing well. BP 110/75 (BP Location: Left Arm)   Pulse 86   Temp 98.2 F (36.8 C) (Oral)   Resp 18   Ht 5\' 2"  (1.575 m)   Wt 71.4 kg   SpO2 100%   Breastfeeding Unknown   BMI 28.79 kg/m  Abdomen is soft and non tender Lochia WNL  PPD # 1  Doing well circ today Routine care Discharge home tomorrow

## 2018-05-05 NOTE — Lactation Note (Signed)
This note was copied from a baby's chart. Lactation Consultation Note  Patient Name: Connie [name] Today's Date: 05/05/2018 Reason for consult: Follow-up assessment Baby is 25 hours old/2% weight loss.  Mom is pumping and syringe feeding colostrum and formula.  She states baby gags on breast and with finger feeding.  Baby did latch and feed for 14 minutes early this morning.  Baby was recently bathed and sleeping skin to skin with mom.  Instructed to watch for feeding cues and call for LC assist.  Maternal Data    Feeding Feeding Type: Formula  LATCH Score                   Interventions    Lactation Tools Discussed/Used     Consult Status Consult Status: Follow-up Date: 05/05/18 Follow-up type: In-patient    Ave Filter 05/05/2018, 2:11 PM

## 2018-05-05 NOTE — Discharge Summary (Signed)
Obstetric Discharge Summary Reason for Admission: rupture of membranes Prenatal Procedures: none Intrapartum Procedures: spontaneous vaginal delivery Postpartum Procedures: none Complications-Operative and Postpartum: first degree perineal laceration Hemoglobin  Date Value Ref Range Status  05/05/2018 11.2 (L) 12.0 - 15.0 g/dL Final   HCT  Date Value Ref Range Status  05/05/2018 32.7 (L) 36.0 - 46.0 % Final    Physical Exam:  General: wnl Lochia: appropriate Uterine Fundus: firm Incision: healing well, no significant drainage, no dehiscence DVT Evaluation: No evidence of DVT seen on physical exam.  Discharge Diagnoses: Term Pregnancy-delivered  Discharge Information: Date: 05/05/2018 Activity: pelvic rest Diet: routine Medications: Ibuprofen Condition: improved Instructions: refer to practice specific booklet Discharge to: home   Newborn Data: Live born female  Birth Weight: 7 lb 5.6 oz (3334 g) APGAR: 5, 7  Newborn Delivery   Birth date/time:  05/04/2018 12:30:00 Delivery type:  Vaginal, Spontaneous     Home with mother.  Cyril Mourning 05/05/2018, 5:27 PM

## 2018-05-13 ENCOUNTER — Inpatient Hospital Stay (HOSPITAL_COMMUNITY): Payer: BLUE CROSS/BLUE SHIELD

## 2018-06-20 DIAGNOSIS — Z1389 Encounter for screening for other disorder: Secondary | ICD-10-CM | POA: Diagnosis not present

## 2018-07-18 DIAGNOSIS — Z30430 Encounter for insertion of intrauterine contraceptive device: Secondary | ICD-10-CM | POA: Diagnosis not present

## 2018-07-18 DIAGNOSIS — Z3202 Encounter for pregnancy test, result negative: Secondary | ICD-10-CM | POA: Diagnosis not present

## 2018-09-11 DIAGNOSIS — H109 Unspecified conjunctivitis: Secondary | ICD-10-CM | POA: Diagnosis not present

## 2018-09-11 DIAGNOSIS — J309 Allergic rhinitis, unspecified: Secondary | ICD-10-CM | POA: Diagnosis not present

## 2018-09-11 DIAGNOSIS — Z681 Body mass index (BMI) 19 or less, adult: Secondary | ICD-10-CM | POA: Diagnosis not present

## 2018-09-17 DIAGNOSIS — L659 Nonscarring hair loss, unspecified: Secondary | ICD-10-CM | POA: Diagnosis not present

## 2018-10-14 ENCOUNTER — Telehealth (HOSPITAL_COMMUNITY): Payer: Self-pay | Admitting: Lactation Services

## 2018-10-14 NOTE — Telephone Encounter (Signed)
Mother called and left a message stating she had some breast feeding questions regarding low milk supply.  I tried to call her back, however, there was no answer.  I left a message and suggested she leave a good call back time for tomorrow, August 19, and a LC would return her phone call.

## 2018-10-14 NOTE — Telephone Encounter (Signed)
Outgoing telephone call - this patient left a message regarding supply decreasing and requesting advice.  LC called mom back and received her answering machine and left a message to call [facility] services back. If it was after 9 pm to leave a message for it to be ok to receive a call back.

## 2019-01-11 DIAGNOSIS — Z20828 Contact with and (suspected) exposure to other viral communicable diseases: Secondary | ICD-10-CM | POA: Diagnosis not present

## 2019-02-06 DIAGNOSIS — H1032 Unspecified acute conjunctivitis, left eye: Secondary | ICD-10-CM | POA: Diagnosis not present

## 2019-02-06 DIAGNOSIS — Z681 Body mass index (BMI) 19 or less, adult: Secondary | ICD-10-CM | POA: Diagnosis not present

## 2019-04-20 DIAGNOSIS — B354 Tinea corporis: Secondary | ICD-10-CM | POA: Diagnosis not present

## 2019-06-27 DIAGNOSIS — K5792 Diverticulitis of intestine, part unspecified, without perforation or abscess without bleeding: Secondary | ICD-10-CM

## 2019-06-27 HISTORY — DX: Diverticulitis of intestine, part unspecified, without perforation or abscess without bleeding: K57.92

## 2019-06-29 ENCOUNTER — Other Ambulatory Visit: Payer: Self-pay

## 2019-06-29 ENCOUNTER — Other Ambulatory Visit (HOSPITAL_COMMUNITY): Payer: Self-pay | Admitting: Family Medicine

## 2019-06-29 ENCOUNTER — Ambulatory Visit (HOSPITAL_COMMUNITY)
Admission: RE | Admit: 2019-06-29 | Discharge: 2019-06-29 | Disposition: A | Discharge status: Home / Self Care | Payer: BC Managed Care – PPO | Source: Ambulatory Visit | Attending: Family Medicine | Admitting: Family Medicine

## 2019-06-29 ENCOUNTER — Other Ambulatory Visit: Payer: Self-pay | Admitting: Family Medicine

## 2019-06-29 DIAGNOSIS — K5792 Diverticulitis of intestine, part unspecified, without perforation or abscess without bleeding: Secondary | ICD-10-CM | POA: Diagnosis not present

## 2019-06-29 DIAGNOSIS — Z6820 Body mass index (BMI) 20.0-20.9, adult: Secondary | ICD-10-CM | POA: Diagnosis not present

## 2019-06-29 DIAGNOSIS — R1032 Left lower quadrant pain: Secondary | ICD-10-CM

## 2019-06-29 DIAGNOSIS — Z01419 Encounter for gynecological examination (general) (routine) without abnormal findings: Secondary | ICD-10-CM | POA: Diagnosis not present

## 2019-06-29 DIAGNOSIS — Z6821 Body mass index (BMI) 21.0-21.9, adult: Secondary | ICD-10-CM | POA: Diagnosis not present

## 2019-06-29 DIAGNOSIS — Z1389 Encounter for screening for other disorder: Secondary | ICD-10-CM | POA: Diagnosis not present

## 2019-06-29 MED ORDER — IOHEXOL 300 MG/ML  SOLN
100.0000 mL | Freq: Once | INTRAMUSCULAR | Status: AC | PRN
  Administered 2019-06-29: 100 mL via INTRAVENOUS

## 2019-06-29 MED ORDER — IOHEXOL 9 MG/ML PO SOLN: 500.0000 mL | ORAL | Status: AC

## 2019-07-02 ENCOUNTER — Telehealth: Payer: Self-pay | Admitting: Internal Medicine

## 2019-07-02 NOTE — Telephone Encounter (Signed)
I have looked at the CT. Nonspecific colitis/possible diverticulitis. She should continue her antibiotics. If she is still having abdominal pain, she can try backing down to a clear liquid or soft/low fiber diet until symptoms improve.

## 2019-07-02 NOTE — Telephone Encounter (Signed)
Spoke with pt. Pt notified of [place] recommendations. Pt will f/u at apt.

## 2019-07-02 NOTE — Telephone Encounter (Signed)
PLEASE CALL PATIENT, SHE HAS SOME QUESTIONS ABOUT HER DIET.  WANTS TO KNOW IF SHE NEEDS TO STOP CERTAIN FOODS BECAUSE  OF HER CONDITION-COLITIS

## 2019-07-02 NOTE — Telephone Encounter (Signed)
Goodman, pt called and is scheduled with you on 07/13/19. Pt is currently taking Cipro and Metronidazole for diverticulitis/?colitis. Pt had a CT scan done and the reports are in pts chart. Pt wanted [place] to review the report before pts apt. Pt isn't sure if she needs to avoid certain foods with diverticulitis/colitis?

## 2019-07-06 ENCOUNTER — Other Ambulatory Visit: Payer: Self-pay

## 2019-07-06 ENCOUNTER — Encounter (HOSPITAL_COMMUNITY): Payer: Self-pay | Admitting: *Deleted

## 2019-07-06 ENCOUNTER — Emergency Department (HOSPITAL_COMMUNITY)
Admission: EM | Admit: 2019-07-06 | Discharge: 2019-07-06 | Disposition: A | Discharge status: Home / Self Care | Payer: BC Managed Care – PPO | Attending: Emergency Medicine | Admitting: Emergency Medicine

## 2019-07-06 DIAGNOSIS — I959 Hypotension, unspecified: Secondary | ICD-10-CM | POA: Diagnosis not present

## 2019-07-06 DIAGNOSIS — E860 Dehydration: Secondary | ICD-10-CM | POA: Diagnosis not present

## 2019-07-06 DIAGNOSIS — R404 Transient alteration of awareness: Secondary | ICD-10-CM | POA: Diagnosis not present

## 2019-07-06 DIAGNOSIS — R197 Diarrhea, unspecified: Secondary | ICD-10-CM | POA: Insufficient documentation

## 2019-07-06 DIAGNOSIS — R109 Unspecified abdominal pain: Secondary | ICD-10-CM | POA: Diagnosis not present

## 2019-07-06 DIAGNOSIS — Z79899 Other long term (current) drug therapy: Secondary | ICD-10-CM | POA: Diagnosis not present

## 2019-07-06 DIAGNOSIS — R112 Nausea with vomiting, unspecified: Secondary | ICD-10-CM | POA: Insufficient documentation

## 2019-07-06 DIAGNOSIS — R000 Tachycardia, unspecified: Secondary | ICD-10-CM | POA: Diagnosis not present

## 2019-07-06 LAB — COMPREHENSIVE METABOLIC PANEL
ALT: 17 U/L (ref 0–44)
AST: 40 U/L (ref 15–41)
Albumin: 4.2 g/dL (ref 3.5–5.0)
Alkaline Phosphatase: 70 U/L (ref 38–126)
Anion gap: 8 (ref 5–15)
BUN: 15 mg/dL (ref 6–20)
CO2: 22 mmol/L (ref 22–32)
Calcium: 8 mg/dL — ABNORMAL LOW (ref 8.9–10.3)
Chloride: 109 mmol/L (ref 98–111)
Creatinine, Ser: 0.59 mg/dL (ref 0.44–1.00)
GFR calc Af Amer: >60 mL/min (ref >60)
GFR calc non Af Amer: >60 mL/min (ref >60)
Glucose, Bld: 106 mg/dL — ABNORMAL HIGH (ref 70–99)
Potassium: 3.4 mmol/L — ABNORMAL LOW (ref 3.5–5.1)
Sodium: 139 mmol/L (ref 135–145)
Total Bilirubin: 0.7 mg/dL (ref 0.3–1.2)
Total Protein: 7.2 g/dL (ref 6.5–8.1)

## 2019-07-06 LAB — BASIC METABOLIC PANEL
Anion gap: 4 — ABNORMAL LOW (ref 5–15)
BUN: 7 mg/dL (ref 6–20)
CO2: 20 mmol/L — ABNORMAL LOW (ref 22–32)
Calcium: 7.2 mg/dL — ABNORMAL LOW (ref 8.9–10.3)
Chloride: 112 mmol/L — ABNORMAL HIGH (ref 98–111)
Creatinine, Ser: 0.59 mg/dL (ref 0.44–1.00)
GFR calc Af Amer: >60 mL/min (ref >60)
GFR calc non Af Amer: >60 mL/min (ref >60)
Glucose, Bld: 93 mg/dL (ref 70–99)
Potassium: 3.2 mmol/L — ABNORMAL LOW (ref 3.5–5.1)
Sodium: 136 mmol/L (ref 135–145)

## 2019-07-06 LAB — CBC WITH DIFFERENTIAL/PLATELET
Abs Immature Granulocytes: 0.03 10*3/uL (ref 0.00–0.07)
Basophils Absolute: 0 10*3/uL (ref 0.0–0.1)
Basophils Relative: 0 %
Eosinophils Absolute: 0 10*3/uL (ref 0.0–0.5)
Eosinophils Relative: 0 %
HCT: 44 % (ref 36.0–46.0)
Hemoglobin: 14.6 g/dL (ref 12.0–15.0)
Immature Granulocytes: 0 %
Lymphocytes Relative: 3 %
Lymphs Abs: 0.4 10*3/uL — ABNORMAL LOW (ref 0.7–4.0)
MCH: 29.7 pg (ref 26.0–34.0)
MCHC: 33.2 g/dL (ref 30.0–36.0)
MCV: 89.4 fL (ref 80.0–100.0)
Monocytes Absolute: 0.8 10*3/uL (ref 0.1–1.0)
Monocytes Relative: 7 %
Neutro Abs: 9.4 10*3/uL — ABNORMAL HIGH (ref 1.7–7.7)
Neutrophils Relative %: 90 %
Platelets: 292 10*3/uL (ref 150–400)
RBC: 4.92 MIL/uL (ref 3.87–5.11)
RDW: 11.7 % (ref 11.5–15.5)
WBC: 10.6 10*3/uL — ABNORMAL HIGH (ref 4.0–10.5)
nRBC: 0 % (ref 0.0–0.2)

## 2019-07-06 LAB — LIPASE, BLOOD: Lipase: 27 U/L (ref 11–51)

## 2019-07-06 LAB — LACTIC ACID, PLASMA
Lactic Acid, Venous: 1.5 mmol/L (ref 0.5–1.9)
Lactic Acid, Venous: 0.7 mmol/L (ref 0.5–1.9)

## 2019-07-06 MED ORDER — DEXTROSE 5 % AND 0.9 % NACL IV BOLUS
1000.0000 mL | Freq: Once | INTRAVENOUS | Status: AC
  Administered 2019-07-06: 1000 mL via INTRAVENOUS

## 2019-07-06 MED ORDER — METOCLOPRAMIDE HCL 5 MG/ML IJ SOLN
5.0000 mg | Freq: Once | INTRAMUSCULAR | Status: AC
  Administered 2019-07-06: 07:00:00 5 mg via INTRAVENOUS
  Filled 2019-07-06: qty 2

## 2019-07-06 MED ORDER — ONDANSETRON HCL 4 MG PO TABS: 4.0000 mg | ORAL_TABLET | Freq: Four times a day (QID) | ORAL | 0 refills | Status: DC

## 2019-07-06 MED ORDER — POTASSIUM CHLORIDE CRYS ER 20 MEQ PO TBCR
40.0000 meq | EXTENDED_RELEASE_TABLET | Freq: Once | ORAL | Status: AC
  Administered 2019-07-06: 40 meq via ORAL
  Filled 2019-07-06: qty 2

## 2019-07-06 MED ORDER — SODIUM CHLORIDE 0.9 % IV BOLUS
500.0000 mL | Freq: Once | INTRAVENOUS | Status: AC
  Administered 2019-07-06: 500 mL via INTRAVENOUS

## 2019-07-06 MED ORDER — SODIUM CHLORIDE 0.9 % IV BOLUS
1000.0000 mL | Freq: Once | INTRAVENOUS | Status: AC
  Administered 2019-07-06: 1000 mL via INTRAVENOUS

## 2019-07-06 MED ORDER — DIPHENHYDRAMINE HCL 50 MG/ML IJ SOLN
25.0000 mg | Freq: Once | INTRAMUSCULAR | Status: AC
  Administered 2019-07-06: 25 mg via INTRAVENOUS
  Filled 2019-07-06: qty 1

## 2019-07-06 MED ORDER — LOPERAMIDE HCL 2 MG PO CAPS
2.0000 mg | ORAL_CAPSULE | Freq: Once | ORAL | Status: AC
  Administered 2019-07-06: 2 mg via ORAL
  Filled 2019-07-06: qty 1

## 2019-07-06 MED ORDER — ONDANSETRON HCL 4 MG/2ML IJ SOLN
4.0000 mg | Freq: Once | INTRAMUSCULAR | Status: AC
  Administered 2019-07-06: 4 mg via INTRAVENOUS
  Filled 2019-07-06: qty 2

## 2019-07-06 NOTE — ED Notes (Signed)
Pt BP standing 88/51 and HR 132 prior to being placed up for discharge. EDP made aware. Will give another bolus of fluids

## 2019-07-06 NOTE — ED Provider Notes (Signed)
[facility] EMERGENCY DEPARTMENT Provider Note   CSN: [id number] Arrival date & time: 07/06/19  0355   Time seen 4:08 AM  History Chief Complaint  Patient presents with  . Emesis    Connie [name] is a 29 y.o. female.  HPI   Patient states she started having abdominal pain on May 2.  She had a CT scan done on May 3 and was diagnosed with colitis.  She states she started her antibiotics the following day which was May 4.  She was prescribed Flagyl and Cipro.  She states her pain was gone by May 6 but she still had abdominal bloating and cramping after eating.  She states yesterday, may knife she was bloated and crampy all day long.  She states when she had her colitis she was having pain in her left lower quadrant.  She states this evening they ate salmon for dinner around 8 PM.  Her husband got sick about 9 PM and is and is also a patient in the ED.  She states about 10 PM she started having vomiting alternating with diarrhea and has had about 15 episodes of vomiting and about 12 episodes of diarrhea that was initially loose and is now watery.  She denies fever.  She states just prior to coming to the ED she felt dizzy and lightheaded and was numb all over.  EMS reports her blood pressure was 90/60 and they gave her 250 cc bolus of normal saline and her blood pressure improved.  Patient states now she is having epigastric pain and she is not having pain where she had her colitis pain before.  [facility], Pharmacologist  Past Medical History:  Diagnosis Date  . Hypoglycemic reaction   . IBS (irritable bowel syndrome)     Patient Active Problem List   Diagnosis Date Noted  . Pregnancy 05/03/2018  . Liver mass 11/25/2015  . Abdominal pain, epigastric 11/25/2015  . Lower abdominal pain 11/25/2015  . Body mass index between 19-24, adult 06/24/2013    Past Surgical History:  Procedure Laterality Date  . NO PAST SURGERIES    . WISDOM TOOTH EXTRACTION       OB History    Gravida  1   Para  1   Term  1   Preterm      AB      Living  1     SAB      TAB      Ectopic      Multiple  0   Live Births  1           Family History  Problem Relation Age of Onset  . Hypertension Mother   . Heart attack Other     Social History   Tobacco Use  . Smoking status: Never Smoker  . Smokeless tobacco: Never Used  Substance Use Topics  . Alcohol use: No  . Drug use: No    Home Medications Prior to Admission medications   Medication Sig Start Date End Date Taking? Authorizing Provider  acetaminophen (TYLENOL) 500 MG tablet Take 500 mg by mouth daily as needed for moderate pain.   Yes [provider]  calcium carbonate (TUMS - DOSED IN MG ELEMENTAL CALCIUM) 500 MG chewable tablet Chew 1 tablet by mouth daily as needed for indigestion or heartburn.   Yes [provider]  ciprofloxacin (CIPRO) 500 MG tablet Take 500 mg by mouth 2 (two) times daily. 06/29/19  Yes [provider]  metroNIDAZOLE (FLAGYL) 500 MG tablet Take 500 mg by mouth 3 (three) times daily. 06/29/19  Yes [provider]  doxylamine, Sleep, (UNISOM) 25 MG tablet Take 12.5 mg by mouth at bedtime.    [provider]  Hyoscyamine Sulfate SL (LEVSIN/SL) 0.125 MG SUBL Place 0.125 mg under the tongue 4 (four) times daily -  before meals and at bedtime. Patient not taking: Reported on 05/03/2018 02/07/16   Daneil Dolin, MD  ondansetron (ZOFRAN) 4 MG tablet Take 1 tablet (4 mg total) by mouth every 6 (six) hours. 07/06/19   Rolland Porter, MD  prenatal vitamin w/FE, FA (PRENATAL 1 + 1) 27-1 MG TABS tablet Take 1 tablet by mouth daily at 12 noon.    [provider]  Pyridoxine HCl (B-6 PO) Take 0.5 tablets by mouth daily.    [provider]    Allergies    Patient has no known allergies.  Review of Systems   Review of Systems  All other systems reviewed and are negative.   Physical Exam Updated Vital Signs BP (!) 100/59    Pulse (!) 112   Temp (!) 97.5 F (36.4 C)   Resp 16   Ht 5\' 2"  (1.575 m)   Wt 52.2 kg   SpO2 100%   BMI 21.03 kg/m   Physical Exam Vitals and nursing note reviewed.  Constitutional:      General: She is in acute distress.     Appearance: Normal appearance. She is well-developed. She is not ill-appearing or toxic-appearing.     Comments: Patient looks like she feels bad  HENT:     Head: Normocephalic and atraumatic.     Right Ear: External ear normal.     Left Ear: External ear normal.     Nose: Nose normal. No mucosal edema or rhinorrhea.     Mouth/Throat:     Mouth: Mucous membranes are dry.     Dentition: No dental abscesses.     Pharynx: No uvula swelling.  Eyes:     Extraocular Movements: Extraocular movements intact.     Conjunctiva/sclera: Conjunctivae normal.     Pupils: Pupils are equal, round, and reactive to light.  Cardiovascular:     Rate and Rhythm: Normal rate and regular rhythm.     Heart sounds: Normal heart sounds. No murmur. No friction rub. No gallop.   Pulmonary:     Effort: Pulmonary effort is normal. No respiratory distress.     Breath sounds: Normal breath sounds. No wheezing, rhonchi or rales.  Chest:     Chest wall: No tenderness or crepitus.  Abdominal:     General: Abdomen is flat. Bowel sounds are normal. There is no distension.     Palpations: Abdomen is soft.     Tenderness: There is no abdominal tenderness. There is no guarding or rebound.  Musculoskeletal:        General: No tenderness. Normal range of motion.     Cervical back: Full passive range of motion without pain, normal range of motion and neck supple.     Comments: Moves all extremities well.   Skin:    General: Skin is warm and dry.     Capillary Refill: Capillary refill takes less than 2 seconds.     Coloration: Skin is not pale.     Findings: No erythema or rash.  Neurological:     General: No focal deficit present.     Mental Status: She is alert and oriented  to person,  place, and time.     Cranial Nerves: No cranial nerve deficit.  Psychiatric:        Mood and Affect: Mood normal. Mood is not anxious.        Speech: Speech normal.        Behavior: Behavior normal.        Thought Content: Thought content normal.     ED Results / Procedures / Treatments   Labs (all labs ordered are listed, but only abnormal results are displayed) Results for orders placed or performed during the hospital encounter of 07/06/19  Comprehensive metabolic panel  Result Value Ref Range   Sodium 139 135 - 145 mmol/L   Potassium 3.4 (L) 3.5 - 5.1 mmol/L   Chloride 109 98 - 111 mmol/L   CO2 22 22 - 32 mmol/L   Glucose, Bld 106 (H) 70 - 99 mg/dL   BUN 15 6 - 20 mg/dL   Creatinine, Ser 0.59 0.44 - 1.00 mg/dL   Calcium 8.0 (L) 8.9 - 10.3 mg/dL   Total Protein 7.2 6.5 - 8.1 g/dL   Albumin 4.2 3.5 - 5.0 g/dL   AST 40 15 - 41 U/L   ALT 17 0 - 44 U/L   Alkaline Phosphatase 70 38 - 126 U/L   Total Bilirubin 0.7 0.3 - 1.2 mg/dL   GFR calc non Af Amer >60 >60 mL/min   GFR calc Af Amer >60 >60 mL/min   Anion gap 8 5 - 15  Lipase, blood  Result Value Ref Range   Lipase 27 11 - 51 U/L  CBC with Differential  Result Value Ref Range   WBC 10.6 (H) 4.0 - 10.5 K/uL   RBC 4.92 3.87 - 5.11 MIL/uL   Hemoglobin 14.6 12.0 - 15.0 g/dL   HCT 44.0 36.0 - 46.0 %   MCV 89.4 80.0 - 100.0 fL   MCH 29.7 26.0 - 34.0 pg   MCHC 33.2 30.0 - 36.0 g/dL   RDW 11.7 11.5 - 15.5 %   Platelets 292 150 - 400 K/uL   nRBC 0.0 0.0 - 0.2 %   Neutrophils Relative % 90 %   Neutro Abs 9.4 (H) 1.7 - 7.7 K/uL   Lymphocytes Relative 3 %   Lymphs Abs 0.4 (L) 0.7 - 4.0 K/uL   Monocytes Relative 7 %   Monocytes Absolute 0.8 0.1 - 1.0 K/uL   Eosinophils Relative 0 %   Eosinophils Absolute 0.0 0.0 - 0.5 K/uL   Basophils Relative 0 %   Basophils Absolute 0.0 0.0 - 0.1 K/uL   Immature Granulocytes 0 %   Abs Immature Granulocytes 0.03 0.00 - 0.07 K/uL  Lactic acid, plasma  Result Value Ref Range   Lactic  Acid, Venous 1.5 0.5 - 1.9 mmol/L    Laboratory interpretation all normal except minimal hypokalemia, nonfasting hyperglycemia   EKG None  Radiology No results found.   CT ABDOMEN PELVIS W CONTRAST  Result Date: 06/29/2019 CLINICAL DATA:  Left lower quadrant pain and nausea.  IMPRESSION: 1. Prominent mucosal edema in the descending and proximal sigmoid portions of the colon with pericolonic soft tissue stranding. This is most severe in the left lower quadrant. This could represent colitis or less likely acute diverticulitis. 2. Slight decrease in the size of the exophytic nodule of focal nodular hyperplasia at the inferior aspect of the right lobe of the liver. Electronically Signed   By: Lorriane Shire M.D.   On: 06/29/2019 14:02  Procedures Procedures (including critical care time)  Medications Ordered in ED Medications  metoCLOPramide (REGLAN) injection 5 mg (has no administration in time range)  loperamide (IMODIUM) capsule 2 mg (has no administration in time range)  sodium chloride 0.9 % bolus 1,000 mL (0 mLs Intravenous Stopped 07/06/19 0541)  sodium chloride 0.9 % bolus 1,000 mL (0 mLs Intravenous Stopped 07/06/19 0541)  ondansetron (ZOFRAN) injection 4 mg (4 mg Intravenous Given 07/06/19 0419)  loperamide (IMODIUM) capsule 2 mg (2 mg Oral Given 07/06/19 0505)  ondansetron (ZOFRAN) injection 4 mg (4 mg Intravenous Given 07/06/19 0614)  sodium chloride 0.9 % bolus 500 mL (500 mLs Intravenous New Bag/Given 07/06/19 [id number])    ED Course  I have reviewed the triage vital signs and the nursing notes.  Pertinent labs & imaging results that were available during my care of the patient were reviewed by me and considered in my medical decision making (see chart for details).    MDM Rules/Calculators/A&P                     Patient was given IV fluids, IV nausea medications and oral Imodium.  Recheck at 4:30 AM patient states she is feeling better, she still has some nausea.  She is  willing to try oral fluids.  She states she has had urinary output but her mouth still feels dry.  6:00 AM patient still complaining of some nausea and dry heaves, she was given Zofran IV.  She was given an additional 500 cc of normal saline.  Recheck at 6:50 AM patient had just been to the bathroom.  She states she had dry heaves and another episode of diarrhea.  She was given another dose of Imodium and was given Reglan 5 mg IV, patient is only about 100 pounds.  7:25 AM nurse reports patient's hands are "drawing up, her husband has just left.  She was given Benadryl IV.  This had started before she got the Reglan.  Pt left at change of shift with Dr Ashok Cordia, but she should be able to be discharged home soon.   Final Clinical Impression(s) / ED Diagnoses Final diagnoses:  Nausea vomiting and diarrhea  Dehydration    Rx / DC Orders ED Discharge Orders         Ordered    ondansetron (ZOFRAN) 4 MG tablet  Every 6 hours     07/06/19 0700          Rolland Porter, MD, Barbette Or, MD 07/06/19 [phone]

## 2019-07-06 NOTE — ED Notes (Signed)
Pt reported numb face and cramping fingers. Pt noted to be hyperventilating, instructed to slow breathing down. Pt had moderate liquid green stool, Notified Dr Tomi Bamberger. Ordered benadryl, pt lactating but reports weaning. Pt informed of potential of contraindication but wants medication

## 2019-07-06 NOTE — ED Provider Notes (Addendum)
Signed out by Dr Tomi Bamberger that pt and spouse w nvd illness, and that pt is improving, trial of po and d/c to home.  Pt given additional ivf in ED, d5NS.   Recheck feels improved. bp is soft. Pt denies faintness or dizziness. No abd pain. No fever/chills. abd soft nt.   Trial of po fluids.   Pt reports feeling improved. No recurrent nvd. abd soft nt.   kcl po. Po fluids. Pt tolerating po well. Ambulatory to bathroom, no faintness or lightheadedness. Continues to report feeling improved.   Pt observed additional time as bp soft/low. Recheck several times, denies fever/chills. No abd pain. abd soft non tender on additional rechecks. No cp or sob. No faintness with walking in dept. Repeat labs sent, lactate improved/normal.   On additional recheck  Hr 90s, bp normal. Pt continues to feel improved and states ready for d/c.   Pt appears stable for d/c.   Return precautions provided.        Lajean Saver, MD 07/06/19 [phone]

## 2019-07-06 NOTE — Discharge Instructions (Addendum)
Drink plenty of fluids (clear liquids) then start a bland diet later this morning such as toast, crackers, jello, [employer] chicken noodle soup. Use the zofran for nausea or vomiting. Take imodium OTC for diarrhea. Avoid milk products until the diarrhea is gone.   From today's lab tests, your potassium level is mildly low (3.2) - eat plenty of fruits and vegetables, and follow up with your doctor.   Return to ER right away if worse, new symptoms, high fevers, new or severe pain, severe abdominal pain, persistent vomiting, severe diarrhea, weak/fainting, or other concern.

## 2019-07-06 NOTE — ED Triage Notes (Addendum)
Pt c/o n/v/d, abd cramping that started tonight, bp 90/60 upon ems arrival, pt was given 250 cc bolus normal saline with improvement of bp. Spouse is sick with same symptoms,  Pt currently being treated for colitis,

## 2019-07-09 NOTE — Progress Notes (Signed)
Primary Care Physician:  [facility] Primary Gastroenterologist:  Dr. Gala Romney  Chief Complaint  Patient presents with  . Abdominal Pain    pain is better but having some pressure lower mid abd    HPI:   Connie [name] is a 29 y.o. female presenting today for abdominal pain.   History of likely IBS (pp abdominal bloating/distension/lower abdominal pain/diarrhea) and large right lobe liver lesions consistent with [place] on MRI referred to [facility] for further evaluation/?surgery in 2018. Per Dr. Ainsley Spinner note on 03/28/2017, [place] was unchanged over last 5 months and as [place] is not hormonally responsive, they advised her that it is okay to continue birth control and safe for pregnancy. She was to follow-up in 1 year with labs and MRI. Last seen in our office in December 2017, started on Levsin for IBS.   Telephone call 07/02/2019 with patient requesting for me to review recent CT ordered by PCP.  Stated she was on Cipro and Flagyl for diverticulitis/colitis.  I reviewed the CT A/P with contrast dated 06/29/19 which revealed nonspecific colitis/possible diverticulitis of descending and proximal sigmoid colon.  Recommend she continue antibiotics and if she was still having abdominal pain, she should back down to a clear liquid diet or soft/low fiber diet until symptoms improve.  She was seen in the ED 07/06/2019.  She reported prior left lower quadrant abdominal pain that had started on May 2 had actually resolved by May 6 but she still had abdominal bloating and cramping after eating.  On May 9, she was having bloating and cramping all day.  On May 10, after eating salmon for dinner, she and her husband both became sick.  She reported having 15 episodes of vomiting and 12 episodes of diarrhea.  Reported she was now having epigastric pain rather than LLQ pain. BP was 90/60 per EMS.  Labs with mild hypokalemia, slightly elevated WBC at 10.6.  Otherwise, no significant findings on CBC, CMP.  Lipase  normal.  She received IV fluids, Zofran, Reglan, Benadryl, Imodium, potassium in the ED and had improvement in symptoms and was able to tolerate p.o. intake.  Abdominal pain resolved abdominal exam benign.  Today:  For 1 month, she was having excessive smelly gas. Started feeling like she had to have a BM but couldn't. Then intermittent diarrhea.  Has had IBS for years. Thought she was experiencing a flare. Then had sharp LLQ pain so severe she could barely walk starting May 1. Had CT 5/3 and was placed on antibiotics that day. Took 6 out of 7 days of antibiotics due to onset of acute illness with vomiting/diarrhea on 5/10. States her entire family ended up getting sick. Vomiting and diarrhea have resolved. If eating, she has minimal nausea. Eating bland foods in small amounts. No BM over the last 2 days. Was having frequent watery diarrhea. No blood in the stool or black stool.   LLQ pain had essentially resolved prior to acute illness but continued with quite a bit of bloating and generalized cramping/pain. Since her acute illness with vomiting and diarrhea, she has had improvement in all essentially of her symptoms. Yesterday, she is starting to have pressure in the lower abdomen. If she eats soda or eats a lot of sugar, she will have similar pressure/cramping in her lower abdomen but she has not been consuming these items. She has been drinking a lot of Pedialyte. Abdominal pain is about a 1/10.   No fever. Was having chills prior to CT.  None recently.   No family history of IBD or colon cancer. No unintentional weight loss.   Avoids lactose.   Prior to acute illness, just tylenol. No NSAIDs.   Very rare GERD symptoms. No dysphagia. No upper abdominal pain.   Started nursing again recently as this is the only thing that is soothing her baby who also got sick. Hoping to wean him again soon. Trying not to take many medications since she is breast feeding again.    Past Medical History:   Diagnosis Date  . Focal nodular hyperplasia of liver   . Hypoglycemic reaction   . IBS (irritable bowel syndrome)     Past Surgical History:  Procedure Laterality Date  . WISDOM TOOTH EXTRACTION      Current Outpatient Medications  Medication Sig Dispense Refill  . acetaminophen (TYLENOL) 500 MG tablet Take 500 mg by mouth daily as needed for moderate pain.    Marland Kitchen alum & mag hydroxide-simeth (MYLANTA) [id number] MG/5ML suspension Take 15 mLs by mouth every 6 (six) hours as needed for indigestion or heartburn.    . calcium carbonate (TUMS - DOSED IN MG ELEMENTAL CALCIUM) 500 MG chewable tablet Chew 1 tablet by mouth daily as needed for indigestion or heartburn.    . prenatal vitamin w/FE, FA (PRENATAL 1 + 1) 27-1 MG TABS tablet Take 1 tablet by mouth daily at 12 noon.    . Probiotic Product (PROBIOTIC ACIDOPHILUS BEADS) CAPS Take 1 capsule by mouth daily at 6 (six) AM.    . ondansetron (ZOFRAN) 4 MG tablet Take 1 tablet (4 mg total) by mouth every 6 (six) hours. (Patient not taking: Reported on 07/10/2019) 12 tablet 0   No current facility-administered medications for this visit.    Allergies as of 07/10/2019  . (No Known Allergies)    Family History  Problem Relation Age of Onset  . Hypertension Mother   . Heart attack Other   . Colon cancer Neg Hx   . Inflammatory bowel disease Neg Hx     Social History   Socioeconomic History  . Marital status: Married    Spouse name: Not on file  . Number of children: Not on file  . Years of education: Not on file  . Highest education level: Not on file  Occupational History  . Not on file  Tobacco Use  . Smoking status: Never Smoker  . Smokeless tobacco: Never Used  Substance and Sexual Activity  . Alcohol use: No  . Drug use: No  . Sexual activity: Yes    Birth control/protection: None  Other Topics Concern  . Not on file  Social History Narrative  . Not on file   Social Determinants of Health   Financial Resource  Strain:   . Difficulty of Paying Living Expenses:   Food Insecurity:   . Worried About Charity fundraiser in the Last Year:   . Arboriculturist in the Last Year:   Transportation Needs:   . Film/video editor (Medical):   Marland Kitchen Lack of Transportation (Non-Medical):   Physical Activity:   . Days of Exercise per Week:   . Minutes of Exercise per Session:   Stress:   . Feeling of Stress :   Social Connections:   . Frequency of Communication with Friends and Family:   . Frequency of Social Gatherings with Friends and Family:   . Attends Religious Services:   . Active Member of Clubs or Organizations:   . Attends Club  or Organization Meetings:   Marland Kitchen Marital Status:   Intimate Partner Violence:   . Fear of Current or Ex-Partner:   . Emotionally Abused:   Marland Kitchen Physically Abused:   . Sexually Abused:     Review of Systems: Gen: Denies fever, chills. Mild lightheadedness. Improving.  CV: Denies chest pain or heart palpitations.  Resp: Denies shortness of breath or cough GI: See HPI  GU : Mild burning with urination. Urine is still slightly darker than normal. Usually her urine is clear.  MS: Denies joint pain Derm: Denies rash Psych: Denies depression, anxiety Heme: See HPI.  Physical Exam: BP 101/64   Pulse 88   Temp 97.7 F (36.5 C) (Temporal)   Ht 5\' 2"  (1.575 m)   Wt 111 lb 12.8 oz (50.7 kg)   BMI 20.45 kg/m  General:   Alert and oriented. Pleasant and cooperative. Well-nourished and well-developed.  Head:  Normocephalic and atraumatic. Eyes:  Without icterus, sclera clear and conjunctiva pink.  Ears:  Normal auditory acuity. Lungs:  Clear to auscultation bilaterally. No wheezes, rales, or rhonchi. No distress.  Heart:  S1, S2 present without murmurs appreciated.  Abdomen:  +BS, soft, and non-distended. Very mild tenderness to palpation in the LLQ. No HSM noted. No guarding or rebound. No masses appreciated.  Rectal:  Deferred  Msk:  Symmetrical without gross  deformities. Normal posture. Extremities:  Without edema. Neurologic:  Alert and  oriented x4;  grossly normal neurologically. Skin:  Intact without significant lesions or rashes. Psych:  Normal mood and affect.

## 2019-07-10 ENCOUNTER — Other Ambulatory Visit: Payer: Self-pay

## 2019-07-10 ENCOUNTER — Encounter: Payer: Self-pay | Admitting: Gastroenterology

## 2019-07-10 ENCOUNTER — Ambulatory Visit (INDEPENDENT_AMBULATORY_CARE_PROVIDER_SITE_OTHER): Payer: BC Managed Care – PPO | Admitting: Gastroenterology

## 2019-07-10 VITALS — BP 101/64 | HR 88 | Temp 97.7°F | Ht 62.0 in | Wt 111.8 lb

## 2019-07-10 DIAGNOSIS — R300 Dysuria: Secondary | ICD-10-CM | POA: Insufficient documentation

## 2019-07-10 DIAGNOSIS — R1030 Lower abdominal pain, unspecified: Secondary | ICD-10-CM

## 2019-07-10 NOTE — Patient Instructions (Addendum)
Please have urine analysis completed at [facility] lab.  Continue pushing fluid intake with a goal of your urine being pale yellow to clear.  Follow a bland low fiber diet for now until your abdominal pain resolves completely.  Continue to monitor your lower abdominal pain.  Should you have any worsening of abdominal pain or if your abdominal pain persists, please let me know.  If your pain becomes severe the weekend or you develop fevers or chills, you should proceed to the emergency room.  We will plan to follow-up with you in the office in 4-6 weeks to discuss scheduling a colonoscopy at that time.  Do not hesitate to call if your lower abdominal discomfort continues.  Aliene Altes, PA-C [facility]astro[facility]

## 2019-07-10 NOTE — Assessment & Plan Note (Addendum)
29 year old female with history of IBS who had new onset severe sharp LLQ abdominal pain with associated intermittent diarrhea on May 1st. CT A/P with contrast 5/3 revealed nonspecific colitis/possible diverticulitis of the descending and proximal sigmoid colon. PCP treated her with Cipro and Flagyl. Patient completed 6/7 days of antibiotics as she ended up with what I suspect was an acute viral gastroenteritis on 5/10 with significant nausea, vomiting, and diarrhea as all household members also had the same symptoms. At this point, most all of her symptoms have improved. Diarrhea resolved 2 days ago. No vomiting. Yesterday she started having mild pressure in her lower abdomen again, currently 1/10 in severity which she states is somewhat similar to her prior IBS symptoms/food intolerances. Also admits to mild dysuria. Abdominal exam with mild tenderness to palpation to palpation in the LLQ. No fever or chills. No prior colonoscopy.   Difficult to say whether mild LLQ pain is secondary to mild resolving colitis/diverticulitis vs resolving gastroenteritis vs possible UTI, vs underlying IBS symptoms. Her initial CT findings will need to be followed up with a colonoscopy once she has resolution of symptoms.   Plan:  UA with urine culture. Continue bland diet for now. Continue pushing fluids with goal of urine being pale yellow to clear.  She was advised to continue to monitor her pain over the weekend and let me know if this does not continue to improve or if she has worsening pain. Would consider retreating with antibiotics for colitis/diverticulitis as she was not able to complete her course of antibiotics. Advised she go to the emergency room if she had severe pain over the weekend.  Plan to follow-up in office in 4-6 weeks. She will need a colonoscopy to evaluate prior CT findings once she has had complete resolution of this acute illness.

## 2019-07-10 NOTE — Assessment & Plan Note (Signed)
Addressed under lower abdominal pain.

## 2019-07-11 LAB — URINALYSIS
Bilirubin Urine: NEGATIVE
Glucose, UA: NEGATIVE
Hgb urine dipstick: NEGATIVE
Ketones, ur: NEGATIVE
Leukocytes,Ua: NEGATIVE
Nitrite: NEGATIVE
Protein, ur: NEGATIVE
Specific Gravity, Urine: 1.017 (ref 1.001–1.03)
pH: 7 (ref 5.0–8.0)

## 2019-07-11 LAB — URINE CULTURE
MICRO NUMBER:: 10482841
Result:: NO GROWTH
SPECIMEN QUALITY:: ADEQUATE

## 2019-07-11 NOTE — Progress Notes (Signed)
No signs of infection on urinalysis.

## 2019-08-19 NOTE — Progress Notes (Signed)
Referring Provider: [facility] Primary Care Physician:  [facility] Primary GI Physician: Dr. Gala Romney  Chief Complaint  Patient presents with  . Abdominal Pain    not an issue    HPI:   Connie [name] is a 29 y.o. female with history of of likely IBS (pp abdominal bloating/distension/lower abdominal pain/diarrhea) and large right lobe liver lesions consistent with [place] on MRI referred to [facility] for further evaluation/?surgery in 2018. Per Dr. Ainsley Spinner note on 03/28/2017, [place] was unchanged over last 5 months and as [place] is not hormonally responsive, they advised her that it is okay to continue birth control and safe for pregnancy. She was to follow-up in 1 year with labs and MRI.   She presents today for follow-up of abdominal pain.  She was last seen in our office 07/10/2019.  She had new onset severe sharp LLQ abdominal pain with associated intermittent diarrhea on May 1st. CT A/P with contrast 5/3 revealed nonspecific colitis/possible diverticulitis of the descending and proximal sigmoid colon. PCP treated her with Cipro and Flagyl. Patient completed 6/7 days of antibiotics as she ended up with what was suspected to be an viral gastroenteritis on 5/10 with significant nausea, vomiting, and diarrhea as all household members also had the same symptoms.   At the time of her office visit, diarrhea and vomiting had resolved.  She had started to have mild pressure in her lower abdomen again about 1/10 in severity which she felt was similar to prior IBS/food intolerance symptoms.  Also reported mild dysuria.  Felt it was difficult to determine whether mild LLQ pain was secondary to resolving colitis/diverticulitis versus resolving gastroenteritis versus possible UTI versus underlying IBS.  Plan to check UA with urine culture, continue bland/low fiber diet, continue to monitor for worsening pain with consideration of retreating with antibiotics if she did not continue to  improve, and follow-up in 4-6 weeks to schedule colonoscopy to evaluate prior CT findings.  UA/urine culture negative.  Result note on 5/17 with patient reporting her abdominal pain had resolved.  Mild bloating.  Reported she did go 4 days without a BM but bowels had started moving regularly.  She is advised to use MiraLAX as needed for constipation.  Today: Has been feeling well. Ate pizza on Monday. She and the other coworker that had pizza developed diarrhea. Had been having 5-6 watery BMs daily. Today she is feeling better. Has been eating bland foods since Monday. Stool was more solid this morning. No blood in the stool. No black stool. Had mild abdominal cramping prior to BMs but resolved thereafter. Took imodium, 1 on Monday and 1 on Tuesday.  No nocturnal stools. No N/V.   Historically, she has been fluctuating between constipation and diarrhea. Since starting MiraLAX 1 capful daily, she has been having BMs daily and feeling much better. Has also been avoiding dairy and eggs which has helped with bloating significantly.   No regular reflux symptoms or dysphagia.   She is still breast feeding. She is weaning down. Feeding about once a day. Her baby is 2 months old and it is primarily for comfort.    Past Medical History:  Diagnosis Date  . Focal nodular hyperplasia of liver   . Hypoglycemic reaction   . IBS (irritable bowel syndrome)     Past Surgical History:  Procedure Laterality Date  . WISDOM TOOTH EXTRACTION      Current Outpatient Medications  Medication Sig Dispense Refill  . acetaminophen (TYLENOL)  500 MG tablet Take 500 mg by mouth daily as needed for moderate pain.    Marland Kitchen alum & mag hydroxide-simeth (MYLANTA) 630-160-10 MG/5ML suspension Take 15 mLs by mouth every 6 (six) hours as needed for indigestion or heartburn.    . calcium carbonate (TUMS - DOSED IN MG ELEMENTAL CALCIUM) 500 MG chewable tablet Chew 1 tablet by mouth daily as needed for indigestion or heartburn.      . polyethylene glycol (MIRALAX / GLYCOLAX) 17 g packet Take 17 g by mouth daily.    . prenatal vitamin w/FE, FA (PRENATAL 1 + 1) 27-1 MG TABS tablet Take 1 tablet by mouth daily at 12 noon.    . Probiotic Product (PROBIOTIC ACIDOPHILUS BEADS) CAPS Take 1 capsule by mouth daily at 6 (six) AM.     No current facility-administered medications for this visit.    Allergies as of 08/20/2019  . (No Known Allergies)    Family History  Problem Relation Age of Onset  . Hypertension Mother   . Heart attack Other   . Colon cancer Neg Hx   . Inflammatory bowel disease Neg Hx     Social History   Socioeconomic History  . Marital status: Married    Spouse name: Not on file  . Number of children: Not on file  . Years of education: Not on file  . Highest education level: Not on file  Occupational History  . Not on file  Tobacco Use  . Smoking status: Never Smoker  . Smokeless tobacco: Never Used  Substance and Sexual Activity  . Alcohol use: No  . Drug use: No  . Sexual activity: Yes    Birth control/protection: None  Other Topics Concern  . Not on file  Social History Narrative  . Not on file   Social Determinants of Health   Financial Resource Strain:   . Difficulty of Paying Living Expenses:   Food Insecurity:   . Worried About Charity fundraiser in the Last Year:   . Arboriculturist in the Last Year:   Transportation Needs:   . Film/video editor (Medical):   Marland Kitchen Lack of Transportation (Non-Medical):   Physical Activity:   . Days of Exercise per Week:   . Minutes of Exercise per Session:   Stress:   . Feeling of Stress :   Social Connections:   . Frequency of Communication with Friends and Family:   . Frequency of Social Gatherings with Friends and Family:   . Attends Religious Services:   . Active Member of Clubs or Organizations:   . Attends Archivist Meetings:   Marland Kitchen Marital Status:     Review of Systems: Gen: Denies fever, chills,  lightheadedness, dizziness, pre-syncope, or syncope   CV: Denies chest pain or palpitations. Resp: Denies dyspnea or cough GI: See HPI Derm: Denies rash Psych: Denies depression or anxiety Heme: See HPI  Physical Exam: BP 108/71   Pulse 81   Temp (!) 97.1 F (36.2 C)   Ht 5\' 2"  (1.575 m)   Wt 113 lb 9.6 oz (51.5 kg)   BMI 20.78 kg/m  General:   Alert and oriented. No distress noted. Pleasant and cooperative.  Head:  Normocephalic and atraumatic. Eyes:  Conjuctiva clear without scleral icterus. Heart:  S1, S2 present without murmurs appreciated. Lungs:  Clear to auscultation bilaterally. No wheezes, rales, or rhonchi. No distress.  Abdomen:  +BS, soft, non-tender and non-distended. No rebound or guarding. No HSM or  masses noted. Msk:  Symmetrical without gross deformities. Normal posture. Extremities:  Without edema. Neurologic:  Alert and  oriented x4 Psych:  Normal mood and affect.

## 2019-08-20 ENCOUNTER — Ambulatory Visit (INDEPENDENT_AMBULATORY_CARE_PROVIDER_SITE_OTHER): Payer: BC Managed Care – PPO | Admitting: Gastroenterology

## 2019-08-20 ENCOUNTER — Other Ambulatory Visit: Payer: Self-pay

## 2019-08-20 ENCOUNTER — Encounter: Payer: Self-pay | Admitting: Gastroenterology

## 2019-08-20 DIAGNOSIS — K589 Irritable bowel syndrome without diarrhea: Secondary | ICD-10-CM | POA: Diagnosis not present

## 2019-08-20 DIAGNOSIS — R933 Abnormal findings on diagnostic imaging of other parts of digestive tract: Secondary | ICD-10-CM | POA: Diagnosis not present

## 2019-08-20 NOTE — Patient Instructions (Addendum)
Will be scheduled for colonoscopy in the near future with Dr. Gala Romney.  Prior to your procedure, I recommend you pumping and storing milk if you are still breast-feeding at that time. The day of your procedure, you will need to "pump and dump" at least twice.  As you are not producing much milk, you may want to go ahead " pump and dump" a few extra times. You should be able to resume breast-feeding the following day.  You may resume MiraLAX 1 capful daily in 8 oz of water in the next 24 to 48 hours.   Continue to avoid dairy products. You may try taking Lactaid tablets prior to dairy to see if this helps.  We will see back after your colonoscopy.  Do not hesitate to call with questions or concerns prior.  Aliene Altes, PA-C [facility]

## 2019-08-20 NOTE — Assessment & Plan Note (Addendum)
History of IBS with alternating constipation and diarrhea, postprandial abdominal bloating, intermittent lower abdominal pain associated with bowel movements.  Symptoms significantly improved with MiraLAX 1 capful daily and avoidance of dairy products/eggs.  She did eat pizza 3 days ago and had diarrhea; BMs have essentially returned to normal.  No BRBPR or melena.  She did have abnormal CT in May 2021 suggestive of colitis/diverticulitis as discussed below for which we are pursuing colonoscopy as she has improved from her acute illness.  Continue MiraLAX 1 capful (17 g) daily in 8 ounces of water. Continue to avoid dairy products/eggs.  May consider trying Lactaid tablets prior to dairy consumption. Proceed with colonoscopy in the near future as discussed below for abnormal CT of the colon. Follow-up after colonoscopy.

## 2019-08-20 NOTE — Assessment & Plan Note (Addendum)
29 y.o. female with history of IBS (alternating constipation/diarrhea, abdominal discomfort/bloating) who had new onset severe sharp LLQ abdominal pain with associated intermittent diarrhea on May 1st. CT A/P with contrast 5/3 revealed nonspecific colitis/possible diverticulitis of the descending and proximal sigmoid colon. PCP treated her with Cipro and Flagyl. She completed 6/7 days of antibiotics due to developing what was expected to be an acute viral gastroenteritis on 5/10 with significant nausea, vomiting, and diarrhea as all household members also had the same symptoms. She had significant improvement at her last visit on 5/14 but was still recovering with mild pressure in her lower abdomen. She is currently feeling well. Had diarrhea for the last 3 days after eating pizza but otherwise has been having normal BMs daily with significant improvement in IBS symptoms with MiraLAX daily and avoiding dairy products. No brbpr or melena. No family history of colon cancer or IBD.   As patient has now recovered from her acute illness, we will proceed with colonoscopy with Dr. Gala Romney in the near future to evaluate prior CT abnormalities and rule out any underlying lesions.   Notably, patient is still breast-feeding.  She is advised to pump and store her milk prior to her procedure.  She was advised to pump and dump at least twice after her procedure.   Follow-up after colonoscopy.

## 2019-09-01 ENCOUNTER — Telehealth: Payer: Self-pay | Admitting: *Deleted

## 2019-09-01 NOTE — Telephone Encounter (Signed)
Patient needs TCS with RMR.  Called spouse and he provided me with cell# [phone] to called.--LMOVM

## 2019-09-03 NOTE — Telephone Encounter (Signed)
Spoke with patient. She is scheduled for procedure 8/11 at 10:00am. Patient scheduled for COVID 8/10 at 8:30am. Aware will mail instructions to her, confirmed mailing address is correct.   Per [employer] website: "The selected member does not require an Order ID from [employer] for surgical services. Please contact the health plan using the number on the back of the member's ID card to determine if an Order ID number is needed."

## 2019-09-03 NOTE — Telephone Encounter (Signed)
LMTCB for pt 

## 2019-09-26 ENCOUNTER — Emergency Department (HOSPITAL_COMMUNITY)
Admission: EM | Admit: 2019-09-26 | Discharge: 2019-09-26 | Disposition: A | Discharge status: Home / Self Care | Payer: BC Managed Care – PPO | Attending: Emergency Medicine | Admitting: Emergency Medicine

## 2019-09-26 ENCOUNTER — Encounter (HOSPITAL_COMMUNITY): Payer: Self-pay

## 2019-09-26 ENCOUNTER — Other Ambulatory Visit: Payer: Self-pay

## 2019-09-26 ENCOUNTER — Emergency Department (HOSPITAL_COMMUNITY): Payer: BC Managed Care – PPO

## 2019-09-26 DIAGNOSIS — R079 Chest pain, unspecified: Secondary | ICD-10-CM

## 2019-09-26 DIAGNOSIS — R002 Palpitations: Secondary | ICD-10-CM | POA: Insufficient documentation

## 2019-09-26 DIAGNOSIS — R0789 Other chest pain: Secondary | ICD-10-CM | POA: Diagnosis not present

## 2019-09-26 LAB — CBC WITH DIFFERENTIAL/PLATELET
Abs Immature Granulocytes: 0.02 10*3/uL (ref 0.00–0.07)
Basophils Absolute: 0.1 10*3/uL (ref 0.0–0.1)
Basophils Relative: 1 %
Eosinophils Absolute: 0.1 10*3/uL (ref 0.0–0.5)
Eosinophils Relative: 1 %
HCT: 46.4 % — ABNORMAL HIGH (ref 36.0–46.0)
Hemoglobin: 15.3 g/dL — ABNORMAL HIGH (ref 12.0–15.0)
Immature Granulocytes: 0 %
Lymphocytes Relative: 19 %
Lymphs Abs: 1.7 10*3/uL (ref 0.7–4.0)
MCH: 29.4 pg (ref 26.0–34.0)
MCHC: 33 g/dL (ref 30.0–36.0)
MCV: 89.1 fL (ref 80.0–100.0)
Monocytes Absolute: 0.6 10*3/uL (ref 0.1–1.0)
Monocytes Relative: 6 %
Neutro Abs: 6.5 10*3/uL (ref 1.7–7.7)
Neutrophils Relative %: 73 %
Platelets: 262 10*3/uL (ref 150–400)
RBC: 5.21 MIL/uL — ABNORMAL HIGH (ref 3.87–5.11)
RDW: 12 % (ref 11.5–15.5)
WBC: 8.9 10*3/uL (ref 4.0–10.5)
nRBC: 0 % (ref 0.0–0.2)

## 2019-09-26 LAB — COMPREHENSIVE METABOLIC PANEL
ALT: 16 U/L (ref 0–44)
AST: 34 U/L (ref 15–41)
Albumin: 5 g/dL (ref 3.5–5.0)
Alkaline Phosphatase: 87 U/L (ref 38–126)
Anion gap: 10 (ref 5–15)
BUN: 11 mg/dL (ref 6–20)
CO2: 27 mmol/L (ref 22–32)
Calcium: 10.7 mg/dL — ABNORMAL HIGH (ref 8.9–10.3)
Chloride: 101 mmol/L (ref 98–111)
Creatinine, Ser: 0.6 mg/dL (ref 0.44–1.00)
GFR calc Af Amer: >60 mL/min (ref >60)
GFR calc non Af Amer: >60 mL/min (ref >60)
Glucose, Bld: 94 mg/dL (ref 70–99)
Potassium: 3.6 mmol/L (ref 3.5–5.1)
Sodium: 138 mmol/L (ref 135–145)
Total Bilirubin: 0.8 mg/dL (ref 0.3–1.2)
Total Protein: 8.3 g/dL — ABNORMAL HIGH (ref 6.5–8.1)

## 2019-09-26 LAB — TROPONIN I (HIGH SENSITIVITY): Troponin I (High Sensitivity): <2 ng/L (ref <18)

## 2019-09-26 NOTE — Discharge Instructions (Addendum)
If you develop recurrent, continued, or worsening chest pain, shortness of breath, fever, vomiting, abdominal or back pain, or any other new/concerning symptoms then return to the ER for evaluation.  

## 2019-09-26 NOTE — ED Provider Notes (Signed)
[facility] Provider Note   CSN: [id number] Arrival date & time: 09/26/19  1454     History Chief Complaint  Patient presents with  . Chest Pain    Connie [name] is a 29 y.o. female.  HPI 29 year old female presents with chest burning and tightness since around 10 AM. Got a little better after nap and then Tums but not all the way gone.  Had left arm coldness and felt heavy.  All the symptoms are now gone. No leg swelling/pain. No dyspnea, sweating.  She had a little bit of a feeling like she needed diarrhea but that is gone.   Past Medical History:  Diagnosis Date  . Focal nodular hyperplasia of liver   . Hypoglycemic reaction   . IBS (irritable bowel syndrome)     Patient Active Problem List   Diagnosis Date Noted  . Abnormal CT scan, colon 08/20/2019  . IBS (irritable bowel syndrome) 08/20/2019  . Dysuria 07/10/2019  . Pregnancy 05/03/2018  . Liver mass 11/25/2015  . Abdominal pain, epigastric 11/25/2015  . Lower abdominal pain 11/25/2015  . Body mass index between 19-24, adult 06/24/2013    Past Surgical History:  Procedure Laterality Date  . WISDOM TOOTH EXTRACTION       OB History    Gravida  1   Para  1   Term  1   Preterm      AB      Living  1     SAB      TAB      Ectopic      Multiple  0   Live Births  1           Family History  Problem Relation Age of Onset  . Hypertension Mother   . Heart attack Other   . Colon cancer Neg Hx   . Inflammatory bowel disease Neg Hx     Social History   Tobacco Use  . Smoking status: Never Smoker  . Smokeless tobacco: Never Used  Substance Use Topics  . Alcohol use: No  . Drug use: No    Home Medications Prior to Admission medications   Medication Sig Start Date End Date Taking? Authorizing Provider  acetaminophen (TYLENOL) 500 MG tablet Take 500 mg by mouth daily as needed for moderate pain.   Yes [provider]  cetirizine (ZYRTEC) 10 MG tablet  Take 10 mg by mouth daily.   Yes [provider]  prenatal vitamin w/FE, FA (PRENATAL 1 + 1) 27-1 MG TABS tablet Take 1 tablet by mouth daily at 12 noon.   Yes [provider]  Probiotic Product (ALIGN) 4 MG CAPS Take 4 mg by mouth daily.   Yes [provider]    Allergies    Patient has no known allergies.  Review of Systems   Review of Systems  Constitutional: Negative for fever.  Respiratory: Positive for chest tightness. Negative for shortness of breath.   Cardiovascular: Positive for chest pain. Negative for leg swelling.  Neurological: Positive for numbness.  All other systems reviewed and are negative.   Physical Exam Updated Vital Signs BP (!) 143/88 (BP Location: Right Arm)   Pulse 92   Temp 98.3 F (36.8 C) (Oral)   Resp 18   Ht 5\' 2"  (1.575 m)   Wt 49.9 kg   SpO2 100%   Breastfeeding Yes   BMI 20.12 kg/m   Physical Exam Vitals and nursing note reviewed.  Constitutional:  General: She is not in acute distress.    Appearance: She is well-developed. She is not ill-appearing or diaphoretic.  HENT:     Head: Normocephalic and atraumatic.     Right Ear: External ear normal.     Left Ear: External ear normal.     Nose: Nose normal.  Eyes:     General:        Right eye: No discharge.        Left eye: No discharge.  Cardiovascular:     Rate and Rhythm: Normal rate and regular rhythm.     Pulses:          Radial pulses are 2+ on the right side and 2+ on the left side.     Heart sounds: Normal heart sounds.  Pulmonary:     Effort: Pulmonary effort is normal.     Breath sounds: Normal breath sounds.  Abdominal:     Palpations: Abdomen is soft.     Tenderness: There is no abdominal tenderness.  Musculoskeletal:     Right lower leg: No tenderness. No edema.     Left lower leg: No tenderness. No edema.  Skin:    General: Skin is warm and dry.  Neurological:     Mental Status: She is alert.  Psychiatric:        Mood and Affect:  Mood is not anxious.     ED Results / Procedures / Treatments   Labs (all labs ordered are listed, but only abnormal results are displayed) Labs Reviewed  CBC WITH DIFFERENTIAL/PLATELET - Abnormal; Notable for the following components:      Result Value   RBC 5.21 (*)    Hemoglobin 15.3 (*)    HCT 46.4 (*)    All other components within normal limits  COMPREHENSIVE METABOLIC PANEL - Abnormal; Notable for the following components:   Calcium 10.7 (*)    Total Protein 8.3 (*)    All other components within normal limits  TROPONIN I (HIGH SENSITIVITY)  TROPONIN I (HIGH SENSITIVITY)    EKG EKG Interpretation  Date/Time:  Saturday September 26 2019 15:07:55 EDT Ventricular Rate:  95 PR Interval:    QRS Duration: 76 QT Interval:  344 QTC Calculation: 432 R Axis:   84 Text Interpretation: Normal sinus rhythm no acute ST/T changes No old tracing to compare Confirmed by Sherwood Gambler [phone]) on 09/26/2019 4:07:13 PM   Radiology DG Chest 2 View  Result Date: 09/26/2019 CLINICAL DATA:  Chest pain EXAM: CHEST - 2 VIEW COMPARISON:  None. FINDINGS: The heart size and mediastinal contours are within normal limits. Both lungs are clear. Dextroscoliotic curvature of the thoracic spine. IMPRESSION: No active cardiopulmonary disease. Electronically Signed   By: Davina Poke D.O.   On: 09/26/2019 15:56    Procedures Procedures (including critical care time)  Medications Ordered in ED Medications - No data to display  ED Course  I have reviewed the triage vital signs and the nursing notes.  Pertinent labs & imaging results that were available during my care of the patient were reviewed by me and considered in my medical decision making (see chart for details).    MDM Rules/Calculators/A&P                          Patient is well-appearing.  Currently asymptomatic.  Troponin was obtained about 7 hours after original onset and so I think my suspicion of missed ACS is pretty low.  I  discussed with her and offered second troponin but she declines.  Low suspicion for PE or dissection.  I discussed her mild hypercalcemia, advised increased fluids, follow up with PCP for recheck. Final Clinical Impression(s) / ED Diagnoses Final diagnoses:  Chest pain    Rx / DC Orders ED Discharge Orders    None       Sherwood Gambler, MD 09/26/19 2101

## 2019-09-26 NOTE — ED Triage Notes (Signed)
Pt reports at 1030 started feeling weak, having chest pain.  Reports has IBS and thought it was heart burn.  Pt says tried to rest and when she got up, she felt worse.  Pt says she took some tums and it helped relieve some of her chest pain.  Pt says felt some tightness in her left upper chest and had a "tingling sensation shoot through" her left arm and arm felt cold.   Pt says the tightness in her chest and the tingling feeling in her arm have gotten better but not completely gone.

## 2019-09-26 NOTE — ED Notes (Signed)
Pt reports she is going to have to leave and will pull up her discharge instructions on my chart

## 2019-09-28 DIAGNOSIS — R0789 Other chest pain: Secondary | ICD-10-CM | POA: Diagnosis not present

## 2019-09-28 DIAGNOSIS — M4134 Thoracogenic scoliosis, thoracic region: Secondary | ICD-10-CM | POA: Diagnosis not present

## 2019-10-05 ENCOUNTER — Telehealth: Payer: Self-pay | Admitting: *Deleted

## 2019-10-05 NOTE — Telephone Encounter (Signed)
Received VM from patient stating her husband was diagnosed with covid and she now has to quarantine. She is scheduled for procedure Wednesday and needs to R/S. Called endo and made aware  LMOVM for pt to see if she wants to r/s.

## 2019-10-06 ENCOUNTER — Other Ambulatory Visit (HOSPITAL_COMMUNITY): Payer: BC Managed Care – PPO

## 2019-10-07 ENCOUNTER — Encounter (HOSPITAL_COMMUNITY): Payer: Self-pay

## 2019-10-07 ENCOUNTER — Ambulatory Visit (HOSPITAL_COMMUNITY): Admit: 2019-10-07 | Payer: BC Managed Care – PPO | Admitting: Internal Medicine

## 2019-10-07 SURGERY — COLONOSCOPY
Anesthesia: Moderate Sedation

## 2019-10-22 ENCOUNTER — Telehealth: Payer: Self-pay | Admitting: Internal Medicine

## 2019-10-22 NOTE — Telephone Encounter (Signed)
Called pt, TCS with Dr. Gala Romney rescheduled to 12/02/19 at 12:45pm. She tested positive for COVID 10/09/19 at [facility], result was reported to health dept. Symptoms started 10/07/19. She is aware the hospital will need proof of positive result and will not need retest within 90 days. She will check when she gets home re: obtaining result. Gave her endo scheduler phone# if she has any questions. LMOVM to inform endo scheduler. Orders entered. New instructions mailed.

## 2019-10-22 NOTE — Telephone Encounter (Signed)
Pt is wanting to reschedule her procedure. She had cancelled before due to her family having to quarantine for covid. [phone]

## 2019-10-26 NOTE — Telephone Encounter (Signed)
Pt faxed positive COVID result. Result sent to be scanned into chart.

## 2019-11-09 DIAGNOSIS — U071 COVID-19: Secondary | ICD-10-CM | POA: Diagnosis not present

## 2019-11-09 DIAGNOSIS — J45909 Unspecified asthma, uncomplicated: Secondary | ICD-10-CM | POA: Diagnosis not present

## 2019-11-09 DIAGNOSIS — R0600 Dyspnea, unspecified: Secondary | ICD-10-CM | POA: Diagnosis not present

## 2019-11-09 DIAGNOSIS — R05 Cough: Secondary | ICD-10-CM | POA: Diagnosis not present

## 2019-11-23 DIAGNOSIS — Z0001 Encounter for general adult medical examination with abnormal findings: Secondary | ICD-10-CM | POA: Diagnosis not present

## 2019-11-23 DIAGNOSIS — R7309 Other abnormal glucose: Secondary | ICD-10-CM | POA: Diagnosis not present

## 2019-11-23 DIAGNOSIS — Z6821 Body mass index (BMI) 21.0-21.9, adult: Secondary | ICD-10-CM | POA: Diagnosis not present

## 2019-11-23 DIAGNOSIS — R002 Palpitations: Secondary | ICD-10-CM | POA: Diagnosis not present

## 2019-11-23 DIAGNOSIS — K5730 Diverticulosis of large intestine without perforation or abscess without bleeding: Secondary | ICD-10-CM | POA: Diagnosis not present

## 2019-11-23 DIAGNOSIS — U071 COVID-19: Secondary | ICD-10-CM | POA: Diagnosis not present

## 2019-12-02 ENCOUNTER — Encounter (HOSPITAL_COMMUNITY): Payer: Self-pay | Admitting: Internal Medicine

## 2019-12-02 ENCOUNTER — Encounter (HOSPITAL_COMMUNITY)
Admission: RE | Disposition: A | Discharge status: Home / Self Care | Payer: Self-pay | Source: Home / Self Care | Attending: Internal Medicine

## 2019-12-02 ENCOUNTER — Telehealth: Payer: Self-pay | Admitting: Internal Medicine

## 2019-12-02 ENCOUNTER — Other Ambulatory Visit: Payer: Self-pay

## 2019-12-02 ENCOUNTER — Ambulatory Visit (HOSPITAL_COMMUNITY)
Admission: RE | Admit: 2019-12-02 | Discharge: 2019-12-02 | Disposition: A | Discharge status: Home / Self Care | Payer: BC Managed Care – PPO | Attending: Internal Medicine | Admitting: Internal Medicine

## 2019-12-02 DIAGNOSIS — R933 Abnormal findings on diagnostic imaging of other parts of digestive tract: Secondary | ICD-10-CM | POA: Insufficient documentation

## 2019-12-02 DIAGNOSIS — K589 Irritable bowel syndrome without diarrhea: Secondary | ICD-10-CM | POA: Insufficient documentation

## 2019-12-02 DIAGNOSIS — K5730 Diverticulosis of large intestine without perforation or abscess without bleeding: Secondary | ICD-10-CM | POA: Diagnosis not present

## 2019-12-02 DIAGNOSIS — Z79899 Other long term (current) drug therapy: Secondary | ICD-10-CM | POA: Insufficient documentation

## 2019-12-02 DIAGNOSIS — K5732 Diverticulitis of large intestine without perforation or abscess without bleeding: Secondary | ICD-10-CM | POA: Diagnosis not present

## 2019-12-02 HISTORY — PX: COLONOSCOPY: SHX5424

## 2019-12-02 SURGERY — COLONOSCOPY
Anesthesia: Moderate Sedation

## 2019-12-02 MED ORDER — SODIUM CHLORIDE 0.9 % IV SOLN
INTRAVENOUS | Status: DC
  Administered 2019-12-02: 1000 mL via INTRAVENOUS

## 2019-12-02 MED ORDER — ONDANSETRON HCL 4 MG/2ML IJ SOLN
INTRAMUSCULAR | Status: DC | PRN
  Administered 2019-12-02: 4 mg via INTRAVENOUS

## 2019-12-02 MED ORDER — MIDAZOLAM HCL 5 MG/5ML IJ SOLN
INTRAMUSCULAR | Status: DC | PRN
  Administered 2019-12-02 (×2): 1 mg via INTRAVENOUS
  Administered 2019-12-02 (×2): 2 mg via INTRAVENOUS
  Administered 2019-12-02: 1 mg via INTRAVENOUS

## 2019-12-02 MED ORDER — MEPERIDINE HCL 100 MG/ML IJ SOLN
INTRAMUSCULAR | Status: DC | PRN
Start: 2019-12-02 — End: 2019-12-02
  Administered 2019-12-02 (×2): 25 mg

## 2019-12-02 MED ORDER — STERILE WATER FOR IRRIGATION IR SOLN
Status: DC | PRN
  Administered 2019-12-02: 100 mL

## 2019-12-02 MED ORDER — MEPERIDINE HCL 50 MG/ML IJ SOLN
INTRAMUSCULAR | Status: AC
  Filled 2019-12-02: qty 1

## 2019-12-02 MED ORDER — MIDAZOLAM HCL 5 MG/5ML IJ SOLN
INTRAMUSCULAR | Status: AC
  Filled 2019-12-02: qty 10

## 2019-12-02 MED ORDER — ONDANSETRON HCL 4 MG/2ML IJ SOLN
INTRAMUSCULAR | Status: AC
  Filled 2019-12-02: qty 2

## 2019-12-02 NOTE — H&P (Signed)
@LOGO @   Primary Care Physician:  [facility] Primary Gastroenterologist:  Dr. Gala Romney  Pre-Procedure History & Physical: HPI:  Connie [name] is a 28 y.o. female here for diagnostic colonoscopy to further evaluate abnormal left colon on prior imaging.  Clinically, treated for diverticulitis with resolution of symptoms. She does have baseline IBS  Past Medical History:  Diagnosis Date  . Focal nodular hyperplasia of liver   . Hypoglycemic reaction   . IBS (irritable bowel syndrome)     Past Surgical History:  Procedure Laterality Date  . WISDOM TOOTH EXTRACTION      Prior to Admission medications   Medication Sig Start Date End Date Taking? Authorizing Provider  acetaminophen (TYLENOL) 500 MG tablet Take 500 mg by mouth daily as needed for moderate pain or headache.    Yes [provider]  Ascorbic Acid (VITA-C PO) Take 1 tablet by mouth daily.   Yes [provider]  cetirizine (ZYRTEC) 10 MG tablet Take 10 mg by mouth daily as needed for allergies.    Yes [provider]  cholecalciferol (VITAMIN D) 25 MCG (1000 UNIT) tablet Take 1,000 Units by mouth daily.   Yes [provider]  Multiple Vitamins-Minerals (MULTIVITAMIN WITH MINERALS) tablet Take 1 tablet by mouth daily. Woman   Yes [provider]  Probiotic Product (ALIGN) 4 MG CAPS Take 4 mg by mouth daily.   Yes [provider]  zinc gluconate 50 MG tablet Take 50 mg by mouth daily.   Yes [provider]  albuterol (VENTOLIN HFA) 108 (90 Base) MCG/ACT inhaler Inhale 1-2 puffs into the lungs every 6 (six) hours as needed. 11/09/19   [provider]    Allergies as of 10/22/2019  . (No Known Allergies)    Family History  Problem Relation Age of Onset  . Hypertension Mother   . Heart attack Other   . Colon cancer Neg Hx   . Inflammatory bowel disease Neg Hx     Social History   Socioeconomic History  . Marital status: Married     Spouse name: Not on file  . Number of children: Not on file  . Years of education: Not on file  . Highest education level: Not on file  Occupational History  . Not on file  Tobacco Use  . Smoking status: Never Smoker  . Smokeless tobacco: Never Used  Vaping Use  . Vaping Use: Never used  Substance and Sexual Activity  . Alcohol use: No  . Drug use: No  . Sexual activity: Yes    Birth control/protection: None  Other Topics Concern  . Not on file  Social History Narrative  . Not on file   Social Determinants of Health   Financial Resource Strain:   . Difficulty of Paying Living Expenses: Not on file  Food Insecurity:   . Worried About Charity fundraiser in the Last Year: Not on file  . Ran Out of Food in the Last Year: Not on file  Transportation Needs:   . Lack of Transportation (Medical): Not on file  . Lack of Transportation (Non-Medical): Not on file  Physical Activity:   . Days of Exercise per Week: Not on file  . Minutes of Exercise per Session: Not on file  Stress:   . Feeling of Stress : Not on file  Social Connections:   . Frequency of Communication with Friends and Family: Not on file  . Frequency of Social Gatherings with Friends and Family:  Not on file  . Attends Religious Services: Not on file  . Active Member of Clubs or Organizations: Not on file  . Attends Archivist Meetings: Not on file  . Marital Status: Not on file  Intimate Partner Violence:   . Fear of Current or Ex-Partner: Not on file  . Emotionally Abused: Not on file  . Physically Abused: Not on file  . Sexually Abused: Not on file    Review of Systems: See HPI, otherwise negative ROS  Physical Exam: There were no vitals taken for this visit. General:   Alert,  Well-developed, well-nourished, pleasant and cooperative in NAD Mouth:  No deformity or lesions. Neck:  Supple; no masses or thyromegaly. No significant cervical adenopathy. Lungs:  Clear throughout to  auscultation.   No wheezes, crackles, or rhonchi. No acute distress. Heart:  Regular rate and rhythm; no murmurs, clicks, rubs,  or gallops. Abdomen: Non-distended, normal bowel sounds.  Soft and nontender without appreciable mass or hepatosplenomegaly.  Pulses:  Normal pulses noted. Extremities:  Without clubbing or edema.  Impression/Plan: Pleasant 29 year old lady with history of abnormal appearing left colon on CT.  Clinically, treated for diverticulitis.  Acute symptoms have resolved.  The risks, benefits, limitations, alternatives and imponderables have been reviewed with the patient. Questions have been answered. All parties are agreeable.     Notice: This dictation was prepared with Dragon dictation along with smaller phrase technology. Any transcriptional errors that result from this process are unintentional and may not be corrected upon review.

## 2019-12-02 NOTE — Telephone Encounter (Signed)
[phone] please call patient, has a tcs today and her stool is still not clear or watery

## 2019-12-02 NOTE — Op Note (Signed)
Connie [name] Patient Name: Connie [name] Procedure Date: 12/02/2019 11:11 AM MRN: [id number] Date of Birth: [birth year] Attending MD: Connie [name] , MD CSN: [id number] Age: 29 Admit Type: Outpatient Procedure:                Colonoscopy Indications:              Abnormal CT of the GI tract Providers:                Connie Richards, MD, Connie Riggers, RN, Connie [name], Connie [name], Technician Referring MD:              Medicines:                Midazolam 7 mg IV, Meperidine 50 mg IV Complications:            No immediate complications. Estimated Blood Loss:     Estimated blood loss: none. Procedure:                Pre-Anesthesia Assessment:                           - Prior to the procedure, a History and Physical                            was performed, and patient medications and                            allergies were reviewed. The patient's tolerance of                            previous anesthesia was also reviewed. The risks                            and benefits of the procedure and the sedation                            options and risks were discussed with the patient.                            All questions were answered, and informed consent                            was obtained. Prior Anticoagulants: The patient has                            taken no previous anticoagulant or antiplatelet                            agents. ASA Grade Assessment: II - A patient with                            mild systemic disease. After reviewing the risks  and benefits, the patient was deemed in                            satisfactory condition to undergo the procedure.                           After obtaining informed consent, the colonoscope                            was passed under direct vision. Throughout the                            procedure, the patient's blood pressure, pulse, and                             oxygen saturations were monitored continuously. The                            CF-[id number] ([id number]) scope was introduced through                            the anus and advanced to the the ileocecal valve.                            The colonoscopy was performed without difficulty.                            The patient tolerated the procedure well. The                            quality of the bowel preparation was adequate. Scope In: 12:41:34 PM Scope Out: 12:54:15 PM Scope Withdrawal Time: 0 hours 5 minutes 54 seconds  Total Procedure Duration: 0 hours 12 minutes 41 seconds  Findings:      The perianal and digital rectal examinations were normal.      Scattered medium-mouthed diverticula were found in the sigmoid colon and       descending colon. Several diverticula were everted      The exam was otherwise without abnormality on direct and retroflexion       views. Impression:               - Diverticulosis in the sigmoid colon and in the                            descending colon.                           - The examination was otherwise normal on direct                            and retroflexion views.                           - No specimens collected. i suspect pt did suffer a  bout of diverticulitis earlier in the year. Moderate Sedation:      Moderate (conscious) sedation was administered by the endoscopy nurse       and supervised by the endoscopist. The following parameters were       monitored: oxygen saturation, heart rate, blood pressure, respiratory       rate, EKG, adequacy of pulmonary ventilation, and response to care.       Total physician intraservice time was 20 minutes. Recommendation:           - Patient has a contact number available for                            emergencies. The signs and symptoms of potential                            delayed complications were discussed with the                            patient.  Return to normal activities tomorrow.                            Written discharge instructions were provided to the                            patient.                           - Resume previous diet.                           - Continue present medications.                           - Repeat colonoscopy at age 67 for screening                            purposes.                           - Return to GI office in 6 weeks. Add Benefiber;                            Continue Align daily Procedure Code(s):        --- Professional ---                           [phone], Colonoscopy, flexible; diagnostic, including                            collection of specimen(s) by brushing or washing,                            when performed (separate procedure)                           G0500, Moderate sedation services provided by the  same physician or other qualified health care                            professional performing a gastrointestinal                            endoscopic service that sedation supports,                            requiring the presence of an independent trained                            observer to assist in the monitoring of the                            patient's level of consciousness and physiological                            status; initial 15 minutes of intra-service time;                            patient age 16 years or older (additional time may                            be reported with [phone], as appropriate) Diagnosis Code(s):        --- Professional ---                           K57.30, Diverticulosis of large intestine without                            perforation or abscess without bleeding                           R93.3, Abnormal findings on diagnostic imaging of                            other parts of digestive tract CPT copyright 2019 American Medical Association. All rights reserved. The codes documented in this  report are preliminary and upon coder review may  be revised to meet current compliance requirements. Connie [name]. Connie Cimino, MD Connie Richards, MD 12/02/2019 1:03:52 PM This report has been signed electronically. Number of Addenda: 0

## 2019-12-02 NOTE — Discharge Instructions (Signed)
Colonoscopy Discharge Instructions  Read the instructions outlined below and refer to this sheet in the next few weeks. These discharge instructions provide you with general information on caring for yourself after you leave the hospital. Your doctor may also give you specific instructions. While your treatment has been planned according to the most current medical practices available, unavoidable complications occasionally occur. If you have any problems or questions after discharge, call Dr. Gala Romney at [phone]. ACTIVITY  You may resume your regular activity, but move at a slower pace for the next 24 hours.   Take frequent rest periods for the next 24 hours.   Walking will help get rid of the air and reduce the bloated feeling in your belly (abdomen).   No driving for 24 hours (because of the medicine (anesthesia) used during the test).    Do not sign any important legal documents or operate any machinery for 24 hours (because of the anesthesia used during the test).  NUTRITION  Drink plenty of fluids.   You may resume your normal diet as instructed by your doctor.   Begin with a light meal and progress to your normal diet. Heavy or fried foods are harder to digest and may make you feel sick to your stomach (nauseated).   Avoid alcoholic beverages for 24 hours or as instructed.  MEDICATIONS  You may resume your normal medications unless your doctor tells you otherwise.  WHAT YOU CAN EXPECT TODAY  Some feelings of bloating in the abdomen.   Passage of more gas than usual.   Spotting of blood in your stool or on the toilet paper.  IF YOU HAD POLYPS REMOVED DURING THE COLONOSCOPY:  No aspirin products for 7 days or as instructed.   No alcohol for 7 days or as instructed.   Eat a soft diet for the next 24 hours.  FINDING OUT THE RESULTS OF YOUR TEST Not all test results are available during your visit. If your test results are not back during the visit, make an appointment  with your caregiver to find out the results. Do not assume everything is normal if you have not heard from your caregiver or the medical facility. It is important for you to follow up on all of your test results.  SEEK IMMEDIATE MEDICAL ATTENTION IF:  You have more than a spotting of blood in your stool.   Your belly is swollen (abdominal distention).   You are nauseated or vomiting.   You have a temperature over 101.   You have abdominal pain or discomfort that is severe or gets worse throughout the day.   You have diverticulosis; no evidence of diverticulitis or other abnormality found today  Begin Benefiber 1 tablespoon daily for 3 weeks; then increase to 2 tablespoons daily thereafter  Would use MiraLAX only if you are constipated (go a day or more without a bowel movement)  Continue align every day  Office visit Aliene Altes in 6 to 8 weeks  At patient request, I called Shawna Orleans at [phone]  -  left message with results and recommendations   PATIENT INSTRUCTIONS POST-ANESTHESIA  IMMEDIATELY FOLLOWING SURGERY:  Do not drive or operate machinery for the first twenty four hours after surgery.  Do not make any important decisions for twenty four hours after surgery or while taking narcotic pain medications or sedatives.  If you develop intractable nausea and vomiting or a severe headache please notify your doctor immediately.  FOLLOW-UP:  Please make an appointment with your surgeon  as instructed. You do not need to follow up with anesthesia unless specifically instructed to do so.  WOUND CARE INSTRUCTIONS (if applicable):  Keep a dry clean dressing on the anesthesia/puncture wound site if there is drainage.  Once the wound has quit draining you may leave it open to air.  Generally you should leave the bandage intact for twenty four hours unless there is drainage.  If the epidural site drains for more than 36-48 hours please call the anesthesia department.  QUESTIONS?:   Please feel free to call your physician or the hospital operator if you have any questions, and they will be happy to assist you.      Diverticulosis  Diverticulosis is a condition that develops when small pouches (diverticula) form in the wall of the large intestine (colon). The colon is where water is absorbed and stool (feces) is formed. The pouches form when the inside layer of the colon pushes through weak spots in the outer layers of the colon. You may have a few pouches or many of them. The pouches usually do not cause problems unless they become inflamed or infected. When this happens, the condition is called diverticulitis. What are the causes? The cause of this condition is not known. What increases the risk? The following factors may make you more likely to develop this condition:  Being older than age 73. Your risk for this condition increases with age. Diverticulosis is rare among people younger than age 41. By age 86, many people have it.  Eating a low-fiber diet.  Having frequent constipation.  Being overweight.  Not getting enough exercise.  Smoking.  Taking over-the-counter pain medicines, like aspirin and ibuprofen.  Having a family history of diverticulosis. What are the signs or symptoms? In most people, there are no symptoms of this condition. If you do have symptoms, they may include:  Bloating.  Cramps in the abdomen.  Constipation or diarrhea.  Pain in the lower left side of the abdomen. How is this diagnosed? Because diverticulosis usually has no symptoms, it is most often diagnosed during an exam for other colon problems. The condition may be diagnosed by:  Using a flexible scope to examine the colon (colonoscopy).  Taking an X-ray of the colon after dye has been put into the colon (barium enema).  Having a CT scan. How is this treated? You may not need treatment for this condition. Your health care provider may recommend treatment to prevent  problems. You may need treatment if you have symptoms or if you previously had diverticulitis. Treatment may include:  Eating a high-fiber diet.  Taking a fiber supplement.  Taking a live bacteria supplement (probiotic).  Taking medicine to relax your colon. Follow these instructions at home: Medicines  Take over-the-counter and prescription medicines only as told by your health care provider.  If told by your health care provider, take a fiber supplement or probiotic. Constipation prevention Your condition may cause constipation. To prevent or treat constipation, you may need to:  Drink enough fluid to keep your urine pale yellow.  Take over-the-counter or prescription medicines.  Eat foods that are high in fiber, such as beans, whole grains, and fresh fruits and vegetables.  Limit foods that are high in fat and processed sugars, such as fried or sweet foods.  General instructions  Try not to strain when you have a bowel movement.  Keep all follow-up visits as told by your health care provider. This is important. Contact a health care provider if  you:  Have pain in your abdomen.  Have bloating.  Have cramps.  Have not had a bowel movement in 3 days. Get help right away if:  Your pain gets worse.  Your bloating becomes very bad.  You have a fever or chills, and your symptoms suddenly get worse.  You vomit.  You have bowel movements that are bloody or black.  You have bleeding from your rectum. Summary  Diverticulosis is a condition that develops when small pouches (diverticula) form in the wall of the large intestine (colon).  You may have a few pouches or many of them.  This condition is most often diagnosed during an exam for other colon problems.  Treatment may include increasing the fiber in your diet, taking supplements, or taking medicines. This information is not intended to replace advice given to you by your health care provider. Make sure you  discuss any questions you have with your health care provider. Document Revised: 09/11/2018 Document Reviewed: 09/11/2018 Elsevier Patient Education  [place].

## 2019-12-02 NOTE — Telephone Encounter (Signed)
noted 

## 2019-12-02 NOTE — Telephone Encounter (Signed)
She had a great prep

## 2019-12-02 NOTE — Telephone Encounter (Signed)
Called pt. She states from 6pm-11:30pm last she did miralax every 30 min. Stools watery,yellow In color. They were still watery,yellow in color this morning but her last BM she just had still had stool pieces in it. Patient scheduled for today. Should she proceed with coming in for TCS or reschedule?

## 2019-12-08 ENCOUNTER — Encounter (HOSPITAL_COMMUNITY): Payer: Self-pay | Admitting: Internal Medicine

## 2020-01-18 ENCOUNTER — Ambulatory Visit: Payer: BC Managed Care – PPO | Admitting: Gastroenterology

## 2020-01-20 DIAGNOSIS — J329 Chronic sinusitis, unspecified: Secondary | ICD-10-CM | POA: Diagnosis not present

## 2020-01-28 ENCOUNTER — Ambulatory Visit: Payer: BC Managed Care – PPO | Admitting: Gastroenterology

## 2020-01-28 NOTE — Progress Notes (Signed)
Referring Provider: [facility] Primary Care Physician:  [facility] Primary GI Physician: Dr. Gala Romney  Chief Complaint  Patient presents with  . pp f/u    doing ok    HPI:   Connie [name] is a 29 y.o. female with history of IBS (pp abdominal bloating/distension/lower abdominal pain/diarrhea) and large right lobe liver lesions consistent with [place] on MRI referred to [facility] for further evaluation/?surgery in 2018. Per Dr. 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   She is presenting today for follow-up of LLQ abdominal pain and abnormal CT in May 2021 suggestive of nonspecific colitis/possible diverticulitis now s/p colonoscopy. Notably liver lesion consistent with [place] did continue with mass effect upon adjacent gallbladder but was slightly smaller than prior imaging in 2017/2018 (5.8x5.5x4.3cm vs 7 x 5cm).   She was last seen in our office 08/20/2019.  At that time, she was doing well.  She had an acute episode of diarrhea following pizza a few days prior to her office visit, but symptoms were improving.  With history of IBS, she fluctuates between constipation and diarrhea.  She had started MiraLAX daily and had been doing well with BMs daily.  She had also been avoiding dairy and eggs which had significantly helped with bloating.  Plan to proceed with colonoscopy to further evaluate prior CT abnormalities.  Colonoscopy 12/02/19 with diverticulosis in the sigmoid and descending colon, otherwise normal exam.  Suspected patient likely suffered a bout of diverticulitis earlier in the year.  Recommended repeat colonoscopy at age 45, add Benefiber daily, continue align daily.  Today: Has been doing well. Was taking benefiber daily and having BMs daily without bloating or  abdominal pain. Due to having a lot going on, grandmother recently passing, she has forgotten to take Benefiber over the last 1 week and has noted some return of bloating.  Still taking Align.  Having BMs daily.  Hasn't needed MiraLAX. No blood in the stool or black stool.  Denies nausea, vomiting, GERD symptoms, dysphagia, or any other significant GI symptoms.  Past Medical History:  Diagnosis Date  . Diverticulitis 06/2019  . Focal nodular hyperplasia of liver 2017   Stable imaging x4 years.   . Hypoglycemic reaction   . IBS (irritable bowel syndrome)     Past Surgical History:  Procedure Laterality Date  . COLONOSCOPY N/A 12/02/2019   Procedure: COLONOSCOPY;  Surgeon: Daneil Dolin, MD; diverticulosis in the sigmoid and descending colon, otherwise normal exam.  Repeat colonoscopy at age 49.  Marland Kitchen WISDOM TOOTH EXTRACTION      Current Outpatient Medications  Medication Sig Dispense Refill  . acetaminophen (TYLENOL) 500 MG tablet Take 500 mg by mouth daily as needed for moderate pain or headache.     . albuterol (VENTOLIN HFA) 108 (90 Base) MCG/ACT inhaler Inhale 1-2 puffs into the lungs every 6 (six) hours as needed.    . Ascorbic Acid (VITA-C PO) Take 1 tablet by mouth daily.    . cetirizine (ZYRTEC) 10 MG tablet Take 10 mg by mouth daily as needed for allergies.     . cholecalciferol (VITAMIN D) 25 MCG (1000 UNIT) tablet Take 1,000 Units by mouth daily.    . Multiple Vitamins-Minerals (MULTIVITAMIN WITH MINERALS) tablet Take 1 tablet by mouth daily. Woman    .  Probiotic Product (ALIGN) 4 MG CAPS Take 4 mg by mouth daily.    Marland Kitchen VITAMIN E PO Take by mouth daily.    . Wheat Dextrin (BENEFIBER) POWD Take by mouth daily. 2 Tbsp daily    . zinc gluconate 50 MG tablet Take 50 mg by mouth daily.     No current facility-administered medications for this visit.    Allergies as of 01/29/2020  . (No Known Allergies)    Family History  Problem Relation Age of Onset  . Hypertension Mother     . Heart attack Other   . Colon cancer Neg Hx   . Inflammatory bowel disease Neg Hx     Social History   Socioeconomic History  . Marital status: Married    Spouse name: Not on file  . Number of children: Not on file  . Years of education: Not on file  . Highest education level: Not on file  Occupational History  . Not on file  Tobacco Use  . Smoking status: Never Smoker  . Smokeless tobacco: Never Used  Vaping Use  . Vaping Use: Never used  Substance and Sexual Activity  . Alcohol use: No  . Drug use: No  . Sexual activity: Yes    Birth control/protection: None  Other Topics Concern  . Not on file  Social History Narrative  . Not on file   Social Determinants of Health   Financial Resource Strain:   . Difficulty of Paying Living Expenses: Not on file  Food Insecurity:   . Worried About Charity fundraiser in the Last Year: Not on file  . Ran Out of Food in the Last Year: Not on file  Transportation Needs:   . Lack of Transportation (Medical): Not on file  . Lack of Transportation (Non-Medical): Not on file  Physical Activity:   . Days of Exercise per Week: Not on file  . Minutes of Exercise per Session: Not on file  Stress:   . Feeling of Stress : Not on file  Social Connections:   . Frequency of Communication with Friends and Family: Not on file  . Frequency of Social Gatherings with Friends and Family: Not on file  . Attends Religious Services: Not on file  . Active Member of Clubs or Organizations: Not on file  . Attends Archivist Meetings: Not on file  . Marital Status: Not on file    Review of Systems: Gen: Denies fever, chills, cold or flu like symptoms, lightheadedness, dizziness, presyncope, syncope. CV: Denies chest pain or palpitations. Resp: Denies dyspnea or cough. GI: See HPI Heme: See HPI  Physical Exam: BP 106/66   Pulse 81   Temp 98 F (36.7 C) (Temporal)   Ht 5\' 2"  (1.575 m)   Wt 116 lb 12.8 oz (53 kg)   BMI 21.36 kg/m   General:   Alert and oriented. No distress noted. Pleasant and cooperative.  Head:  Normocephalic and atraumatic. Eyes:  Conjuctiva clear without scleral icterus. Heart:  S1, S2 present without murmurs appreciated. Lungs:  Clear to auscultation bilaterally. No wheezes, rales, or rhonchi. No distress.  Abdomen:  +BS, soft, non-tender and non-distended. No rebound or guarding. No HSM or masses noted. Msk:  Symmetrical without gross deformities. Normal posture. Extremities:  Without edema. Neurologic:  Alert and  oriented x4 Psych: Normal mood and affect.

## 2020-01-29 ENCOUNTER — Other Ambulatory Visit: Payer: Self-pay

## 2020-01-29 ENCOUNTER — Ambulatory Visit (INDEPENDENT_AMBULATORY_CARE_PROVIDER_SITE_OTHER): Payer: BC Managed Care – PPO | Admitting: Gastroenterology

## 2020-01-29 ENCOUNTER — Encounter: Payer: Self-pay | Admitting: Gastroenterology

## 2020-01-29 VITALS — BP 106/66 | HR 81 | Temp 98.0°F | Ht 62.0 in | Wt 116.8 lb

## 2020-01-29 DIAGNOSIS — R933 Abnormal findings on diagnostic imaging of other parts of digestive tract: Secondary | ICD-10-CM

## 2020-01-29 DIAGNOSIS — K589 Irritable bowel syndrome without diarrhea: Secondary | ICD-10-CM | POA: Diagnosis not present

## 2020-01-29 DIAGNOSIS — R160 Hepatomegaly, not elsewhere classified: Secondary | ICD-10-CM | POA: Diagnosis not present

## 2020-01-29 NOTE — Patient Instructions (Addendum)
Continue taking benefiber 2 tablespoons daily.   Continue Align daily.   Use MiraLAX as needed if you go day or more without a BM.   We will plan to see you back as needed. Do not hesitate to call with questions or concerns.   Hope you have a great Christmas!   Aliene Altes, PA-C [facility]

## 2020-01-30 ENCOUNTER — Encounter: Payer: Self-pay | Admitting: Gastroenterology

## 2020-01-30 NOTE — Assessment & Plan Note (Signed)
History of suspected IBS with postprandial abdominal bloating/distention/lower abdominal pain/alternating constipation and diarrhea.  Symptoms are much improved with daily fiber supplement and align daily.  No alarm symptoms.  Recent colonoscopy 12/02/2019 to follow-up on abnormal CT in May 2021 (nonspecific colitis/possible diverticulitis) revealing diverticulosis in the sigmoid and descending colon, otherwise normal exam.  Recommended repeat colonoscopy at age 35.  Advised she continue 2 tablespoons of Benefiber daily, align daily, MiraLAX if going 1 or more days without a bowel movement, and follow-up as needed.

## 2020-01-30 NOTE — Assessment & Plan Note (Signed)
CT May 2021 suggestive of nonspecific colitis/possible diverticulitis now s/p colonoscopy 12/02/2019 revealing diverticulosis in the sigmoid and descending colon, otherwise normal exam.  Suspected patient likely suffered a bout of diverticulitis in May.  Recommend repeat colonoscopy at age 29.  Clinically, she is doing well.  Advise she continue Benefiber daily, align daily, and monitor for return of symptoms.  We will follow up as needed.

## 2020-01-30 NOTE — Assessment & Plan Note (Addendum)
Liver lesions consistent with benign focal nodular hyperplasia identified in 2017 on CT and confirmed on MRI in September 2017.  Follow-up MRI February 2018 stable.  Patient saw Dr. Carlis Abbott with [facility] in 2019 with repeat MRI revealing liver lesion was again stable and consistent with benign FNH.  No plans for any intervention as lesion was stable and unchanged over 5 months and patient was asymptomatic.  Stated FNH is not hormonally responsive and she can continue birth control as desired.  Notably, recent CT May 2021 with liver lesion slightly smaller at 5.8 x 5.5 x 4.3 cm versus 7 x 5 cm on prior imaging.  Again, the lesion was consistent with FNH.  With stable imaging over 4 years, no additional intervention/surveillance is needed unless she were to develop symptoms.

## 2020-02-11 DIAGNOSIS — J22 Unspecified acute lower respiratory infection: Secondary | ICD-10-CM | POA: Diagnosis not present

## 2020-03-08 DIAGNOSIS — J029 Acute pharyngitis, unspecified: Secondary | ICD-10-CM | POA: Diagnosis not present

## 2020-03-08 DIAGNOSIS — Z681 Body mass index (BMI) 19 or less, adult: Secondary | ICD-10-CM | POA: Diagnosis not present

## 2020-04-12 DIAGNOSIS — R109 Unspecified abdominal pain: Secondary | ICD-10-CM | POA: Diagnosis not present

## 2020-04-12 DIAGNOSIS — N760 Acute vaginitis: Secondary | ICD-10-CM | POA: Diagnosis not present

## 2020-04-12 DIAGNOSIS — Z3202 Encounter for pregnancy test, result negative: Secondary | ICD-10-CM | POA: Diagnosis not present

## 2020-04-20 ENCOUNTER — Encounter: Payer: Self-pay | Admitting: Dermatology

## 2020-04-20 ENCOUNTER — Ambulatory Visit (INDEPENDENT_AMBULATORY_CARE_PROVIDER_SITE_OTHER): Payer: BC Managed Care – PPO | Admitting: Dermatology

## 2020-04-20 ENCOUNTER — Other Ambulatory Visit: Payer: Self-pay

## 2020-04-20 DIAGNOSIS — L738 Other specified follicular disorders: Secondary | ICD-10-CM | POA: Diagnosis not present

## 2020-04-20 DIAGNOSIS — D2371 Other benign neoplasm of skin of right lower limb, including hip: Secondary | ICD-10-CM | POA: Diagnosis not present

## 2020-04-20 DIAGNOSIS — D239 Other benign neoplasm of skin, unspecified: Secondary | ICD-10-CM

## 2020-04-20 NOTE — Progress Notes (Addendum)
   New Patient   Subjective  Connie [name] is a 30 y.o. female who presents for the following: Skin Problem (Spots on left arm hard not painful x months/Right lower cafe, raised, not painful, x months/Spots on right upper back/Left nare raised spot x years).  General skin check Location: Newer bumps on right leg and left forearm Duration:  Quality:  Associated Signs/Symptoms: Modifying Factors:  Severity:  Timing: Context:    The following portions of the chart were reviewed this encounter and updated as appropriate:  Tobacco  Allergies  Meds  Problems  Med Hx  Surg Hx  Fam Hx      Objective  Well appearing patient in no apparent distress; mood and affect are within normal limits. Objective  Right Lower Leg - Posterior: To 4 mm firm pink dermal papule; dermoscopy supports dermatofibroma.  Smaller 27mm pink papule with very minimal texture on left forearm may be early DF (dermoscopy no pseudocysts and central lighterer circle).  Objective  Left Ala Nasi: Tiny pore that occasionally fills up and swells slightly; she sometimes expresses some white material.    A focused examination was performed including Head, neck, back, arms, legs, upper chest.. Relevant physical exam findings are noted in the Assessment and Plan.   Assessment & Plan  Dermatofibroma Right Lower Leg - Posterior  Deep, benign nature of this lesion discussed, she chooses to leave it unless it clinically changes.  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

## 2020-04-29 NOTE — Progress Notes (Signed)
I, Lavonna Monarch, MD, have reviewed all documentation for this visit. The documentation on 04/29/20 for the exam, diagnosis, procedures, and orders are all accurate and complete.

## 2020-05-10 DIAGNOSIS — N760 Acute vaginitis: Secondary | ICD-10-CM | POA: Diagnosis not present

## 2020-08-01 ENCOUNTER — Encounter: Payer: Self-pay | Admitting: Dermatology

## 2020-09-05 DIAGNOSIS — N760 Acute vaginitis: Secondary | ICD-10-CM | POA: Diagnosis not present

## 2020-09-05 DIAGNOSIS — Z6822 Body mass index (BMI) 22.0-22.9, adult: Secondary | ICD-10-CM | POA: Diagnosis not present

## 2020-09-05 DIAGNOSIS — Z1331 Encounter for screening for depression: Secondary | ICD-10-CM | POA: Diagnosis not present

## 2020-09-05 DIAGNOSIS — B373 Candidiasis of vulva and vagina: Secondary | ICD-10-CM | POA: Diagnosis not present

## 2020-09-13 DIAGNOSIS — S134XXA Sprain of ligaments of cervical spine, initial encounter: Secondary | ICD-10-CM | POA: Diagnosis not present

## 2020-09-13 DIAGNOSIS — R1013 Epigastric pain: Secondary | ICD-10-CM | POA: Diagnosis not present

## 2020-09-13 DIAGNOSIS — Z6822 Body mass index (BMI) 22.0-22.9, adult: Secondary | ICD-10-CM | POA: Diagnosis not present

## 2020-09-13 DIAGNOSIS — S335XXA Sprain of ligaments of lumbar spine, initial encounter: Secondary | ICD-10-CM | POA: Diagnosis not present

## 2020-09-13 DIAGNOSIS — K5732 Diverticulitis of large intestine without perforation or abscess without bleeding: Secondary | ICD-10-CM | POA: Diagnosis not present

## 2020-09-13 DIAGNOSIS — S233XXA Sprain of ligaments of thoracic spine, initial encounter: Secondary | ICD-10-CM | POA: Diagnosis not present

## 2020-11-11 DIAGNOSIS — J309 Allergic rhinitis, unspecified: Secondary | ICD-10-CM | POA: Diagnosis not present

## 2020-11-11 DIAGNOSIS — Z681 Body mass index (BMI) 19 or less, adult: Secondary | ICD-10-CM | POA: Diagnosis not present

## 2020-11-17 ENCOUNTER — Other Ambulatory Visit: Payer: Self-pay

## 2020-11-17 ENCOUNTER — Ambulatory Visit
Admission: EM | Admit: 2020-11-17 | Discharge: 2020-11-17 | Disposition: A | Discharge status: Home / Self Care | Payer: BC Managed Care – PPO | Attending: Emergency Medicine | Admitting: Emergency Medicine

## 2020-11-17 DIAGNOSIS — J019 Acute sinusitis, unspecified: Secondary | ICD-10-CM

## 2020-11-17 MED ORDER — AMOXICILLIN-POT CLAVULANATE 875-125 MG PO TABS: 1.0000 | ORAL_TABLET | Freq: Two times a day (BID) | ORAL | 0 refills | Status: AC

## 2020-11-17 NOTE — Discharge Instructions (Signed)
Get plenty of rest and push fluids Augmentin for sinus infection Use OTC zyrtec for nasal congestion, runny nose, and/or sore throat Use OTC flonase for nasal congestion and runny nose Use medications daily for symptom relief Use OTC medications like ibuprofen or tylenol as needed fever or pain Call or go to the ED if you have any new or worsening symptoms such as fever, cough, shortness of breath, chest tightness, chest pain, turning blue, changes in mental status, etc..Marland Kitchen

## 2020-11-17 NOTE — ED Triage Notes (Signed)
Pt presents with facial pain and pressure for past week after having URI

## 2020-11-17 NOTE — ED Provider Notes (Signed)
[facility]   [id number] 11/17/20 Arrival Time: 0813   CC: sinus infection  SUBJECTIVE: History from: patient.  Connie [name] is a 30 y.o. female who presents with facial pain and sinus pain/ pressure x 1 week  Reports recent URI.  Has tried OTC medications with minimal relief.  Denies aggravating factors.  Reports previous symptoms in the past.   Denies fever, chills, SOB, wheezing, chest pain, nausea, changes in bowel or bladder habits.     ROS: As per HPI.  All other pertinent ROS negative.     Past Medical History:  Diagnosis Date   Diverticulitis 06/2019   Focal nodular hyperplasia of liver 2017   Stable imaging x4 years.    Hypoglycemic reaction    IBS (irritable bowel syndrome)    Past Surgical History:  Procedure Laterality Date   COLONOSCOPY N/A 12/02/2019   Procedure: COLONOSCOPY;  Surgeon: Daneil Dolin, MD; diverticulosis in the sigmoid and descending colon, otherwise normal exam.  Repeat colonoscopy at age 58.   WISDOM TOOTH EXTRACTION     No Known Allergies No current facility-administered medications on file prior to encounter.   Current Outpatient Medications on File Prior to Encounter  Medication Sig Dispense Refill   acetaminophen (TYLENOL) 500 MG tablet Take 500 mg by mouth daily as needed for moderate pain or headache.      albuterol (VENTOLIN HFA) 108 (90 Base) MCG/ACT inhaler Inhale 1-2 puffs into the lungs every 6 (six) hours as needed.     Ascorbic Acid (VITA-C PO) Take 1 tablet by mouth daily.     cetirizine (ZYRTEC) 10 MG tablet Take 10 mg by mouth daily as needed for allergies.      cholecalciferol (VITAMIN D) 25 MCG (1000 UNIT) tablet Take 1,000 Units by mouth daily.     ciprofloxacin (CIPRO) 500 MG tablet Take 500 mg by mouth 2 (two) times daily.     methylPREDNISolone (MEDROL DOSEPAK) 4 MG TBPK tablet Take by mouth as directed.     Multiple Vitamins-Minerals (MULTIVITAMIN WITH MINERALS) tablet Take 1 tablet by mouth daily. Woman      Probiotic Product (ALIGN) 4 MG CAPS Take 4 mg by mouth daily.     VITAMIN E PO Take by mouth daily.     Wheat Dextrin (BENEFIBER) POWD Take by mouth daily. 2 Tbsp daily     zinc gluconate 50 MG tablet Take 50 mg by mouth daily.     Social History   Socioeconomic History   Marital status: Married    Spouse name: Not on file   Number of children: Not on file   Years of education: Not on file   Highest education level: Not on file  Occupational History   Not on file  Tobacco Use   Smoking status: Never   Smokeless tobacco: Never  Vaping Use   Vaping Use: Never used  Substance and Sexual Activity   Alcohol use: No   Drug use: No   Sexual activity: Yes    Birth control/protection: None  Other Topics Concern   Not on file  Social History Narrative   Not on file   Social Determinants of Health   Financial Resource Strain: Not on file  Food Insecurity: Not on file  Transportation Needs: Not on file  Physical Activity: Not on file  Stress: Not on file  Social Connections: Not on file  Intimate Partner Violence: Not on file   Family History  Problem Relation Age of Onset   Hypertension  Mother    Heart attack Other    Colon cancer Neg Hx    Inflammatory bowel disease Neg Hx     OBJECTIVE:  Vitals:   11/17/20 0840  BP: 107/77  Pulse: 99  Resp: 18  Temp: 97.7 F (36.5 C)  SpO2: 98%     General appearance: alert; appears fatigued, but nontoxic; speaking in full sentences and tolerating own secretions HEENT: NCAT; Ears: EACs clear, TMs pearly gray; Eyes: PERRL.  EOM grossly intact. Nose: nares patent without rhinorrhea, Throat: oropharynx clear, tonsils non erythematous or enlarged, uvula midline  Neck: supple without LAD Lungs: unlabored respirations, symmetrical air entry; cough: absent; no respiratory distress; CTAB Heart: regular rate and rhythm.   Skin: warm and dry Psychological: alert and cooperative; normal mood and affect  ASSESSMENT & PLAN:  1.  Acute non-recurrent sinusitis, unspecified location     Meds ordered this encounter  Medications   amoxicillin-clavulanate (AUGMENTIN) 875-125 MG tablet    Sig: Take 1 tablet by mouth every 12 (twelve) hours for 10 days.    Dispense:  20 tablet    Refill:  0    Order Specific Question:   Supervising Provider    Answer:   Raylene Everts [[id number]]    Get plenty of rest and push fluids Augmentin for sinus infection Use OTC zyrtec for nasal congestion, runny nose, and/or sore throat Use OTC flonase for nasal congestion and runny nose Use medications daily for symptom relief Use OTC medications like ibuprofen or tylenol as needed fever or pain Call or go to the ED if you have any new or worsening symptoms such as fever, cough, shortness of breath, chest tightness, chest pain, turning blue, changes in mental status, etc...   Reviewed expectations re: course of current medical issues. Questions answered. Outlined signs and symptoms indicating need for more acute intervention. Patient verbalized understanding. After Visit Summary given.          Lestine Box, PA-C 11/17/20 [phone]

## 2020-12-09 DIAGNOSIS — Z23 Encounter for immunization: Secondary | ICD-10-CM | POA: Diagnosis not present

## 2020-12-09 DIAGNOSIS — K5732 Diverticulitis of large intestine without perforation or abscess without bleeding: Secondary | ICD-10-CM | POA: Diagnosis not present

## 2020-12-09 DIAGNOSIS — E782 Mixed hyperlipidemia: Secondary | ICD-10-CM | POA: Diagnosis not present

## 2020-12-09 DIAGNOSIS — Z6822 Body mass index (BMI) 22.0-22.9, adult: Secondary | ICD-10-CM | POA: Diagnosis not present

## 2020-12-09 DIAGNOSIS — Z13220 Encounter for screening for lipoid disorders: Secondary | ICD-10-CM | POA: Diagnosis not present

## 2020-12-09 DIAGNOSIS — K588 Other irritable bowel syndrome: Secondary | ICD-10-CM | POA: Diagnosis not present

## 2020-12-09 DIAGNOSIS — Z0000 Encounter for general adult medical examination without abnormal findings: Secondary | ICD-10-CM | POA: Diagnosis not present

## 2020-12-09 DIAGNOSIS — Z0001 Encounter for general adult medical examination with abnormal findings: Secondary | ICD-10-CM | POA: Diagnosis not present

## 2020-12-14 ENCOUNTER — Encounter: Payer: Self-pay | Admitting: Emergency Medicine

## 2020-12-14 ENCOUNTER — Ambulatory Visit
Admission: EM | Admit: 2020-12-14 | Discharge: 2020-12-14 | Disposition: A | Discharge status: Home / Self Care | Payer: BC Managed Care – PPO | Attending: Urgent Care | Admitting: Urgent Care

## 2020-12-14 ENCOUNTER — Other Ambulatory Visit: Payer: Self-pay

## 2020-12-14 DIAGNOSIS — J101 Influenza due to other identified influenza virus with other respiratory manifestations: Secondary | ICD-10-CM

## 2020-12-14 DIAGNOSIS — Z8719 Personal history of other diseases of the digestive system: Secondary | ICD-10-CM

## 2020-12-14 DIAGNOSIS — R0981 Nasal congestion: Secondary | ICD-10-CM

## 2020-12-14 DIAGNOSIS — R058 Other specified cough: Secondary | ICD-10-CM

## 2020-12-14 LAB — POCT INFLUENZA A/B
Influenza A, POC: POSITIVE — AB
Influenza B, POC: NEGATIVE

## 2020-12-14 MED ORDER — BENZONATATE 100 MG PO CAPS: 100.0000 mg | ORAL_CAPSULE | Freq: Three times a day (TID) | ORAL | 0 refills | Status: DC | PRN

## 2020-12-14 MED ORDER — PROMETHAZINE-DM 6.25-15 MG/5ML PO SYRP: 5.0000 mL | ORAL_SOLUTION | Freq: Every evening | ORAL | 0 refills | Status: DC | PRN

## 2020-12-14 NOTE — Discharge Instructions (Signed)
We will manage this as a viral influenza. For sore throat or cough try using a honey-based tea. Use 3 teaspoons of honey with juice squeezed from half lemon. Place shaved pieces of ginger into 1/2-1 cup of water and warm over stove top. Then mix the ingredients and repeat every 4 hours as needed. Please take ibuprofen 600mg  every 6 hours with food alternating with OR taken together with Tylenol 500mg -650mg  every 6 hours for throat pain, fevers, aches and pains. Hydrate very well with at least 2 liters of water. Eat light meals such as soups (chicken and noodles, vegetable, chicken and wild rice).  Do not eat foods that you are allergic to.  Taking an antihistamine like Zyrtec can help against postnasal drainage, sinus congestion.  You can take this together with pseudoephedrine (Sudafed) at a dose of 30-60 mg 3 times a day or twice daily as needed for the same kind of nasal drip, congestion.

## 2020-12-14 NOTE — ED Triage Notes (Addendum)
Patient c/o generalized body aches, nasal congestion,  productive cough x 4 days.   Patient denies fever.   Patient endorses chills. Patient endorses fatigue.   Patient took an at home COVID test with negative results.   Patient hasn't taken any medications for symptoms.

## 2020-12-14 NOTE — ED Provider Notes (Signed)
Connie [name]   MRN: [id number] DOB: 1991/01/19  Subjective:   Connie [name] is a 30 y.o. female presenting for 4-day history of acute onset body aches, productive cough, sinus congestion, malaise and fatigue. Completed a course of Augmentin at the end of September for sinusitis.  Has a history of diverticulitis.  Has 1 sick contact with her son who is also being seen.  Took an at-home COVID test and was negative. Just had her influenza vaccine 1 week ago.  No current facility-administered medications for this encounter.  Current Outpatient Medications:    Ascorbic Acid (VITA-C PO), Take 1 tablet by mouth daily., Disp: , Rfl:    cholecalciferol (VITAMIN D) 25 MCG (1000 UNIT) tablet, Take 1,000 Units by mouth daily., Disp: , Rfl:    Multiple Vitamins-Minerals (MULTIVITAMIN WITH MINERALS) tablet, Take 1 tablet by mouth daily. Woman, Disp: , Rfl:    Probiotic Product (ALIGN) 4 MG CAPS, Take 4 mg by mouth daily., Disp: , Rfl:    VITAMIN E PO, Take by mouth daily., Disp: , Rfl:    Wheat Dextrin (BENEFIBER) POWD, Take by mouth daily. 2 Tbsp daily, Disp: , Rfl:    zinc gluconate 50 MG tablet, Take 50 mg by mouth daily., Disp: , Rfl:    acetaminophen (TYLENOL) 500 MG tablet, Take 500 mg by mouth daily as needed for moderate pain or headache. , Disp: , Rfl:    albuterol (VENTOLIN HFA) 108 (90 Base) MCG/ACT inhaler, Inhale 1-2 puffs into the lungs every 6 (six) hours as needed., Disp: , Rfl:    cetirizine (ZYRTEC) 10 MG tablet, Take 10 mg by mouth daily as needed for allergies. , Disp: , Rfl:    ciprofloxacin (CIPRO) 500 MG tablet, Take 500 mg by mouth 2 (two) times daily., Disp: , Rfl:    methylPREDNISolone (MEDROL DOSEPAK) 4 MG TBPK tablet, Take by mouth as directed., Disp: , Rfl:    No Known Allergies  Past Medical History:  Diagnosis Date   Diverticulitis 06/2019   Focal nodular hyperplasia of liver 2017   Stable imaging x4 years.    Hypoglycemic reaction    IBS (irritable  bowel syndrome)      Past Surgical History:  Procedure Laterality Date   COLONOSCOPY N/A 12/02/2019   Procedure: COLONOSCOPY;  Surgeon: Daneil Dolin, MD; diverticulosis in the sigmoid and descending colon, otherwise normal exam.  Repeat colonoscopy at age 16.   WISDOM TOOTH EXTRACTION      Family History  Problem Relation Age of Onset   Hypertension Mother    Heart attack Other    Colon cancer Neg Hx    Inflammatory bowel disease Neg Hx     Social History   Tobacco Use   Smoking status: Never   Smokeless tobacco: Never  Vaping Use   Vaping Use: Never used  Substance Use Topics   Alcohol use: No   Drug use: No    ROS   Objective:   Vitals: BP 117/73 (BP Location: Right Arm)   Pulse (!) 138   Temp 100.3 F (37.9 C) (Oral)   Resp 17   LMP  (LMP Unknown)   SpO2 95%   Breastfeeding No   Physical Exam Constitutional:      General: She is not in acute distress.    Appearance: Normal appearance. She is well-developed. She is not ill-appearing, toxic-appearing or diaphoretic.  HENT:     Head: Normocephalic and atraumatic.     Right Ear: Tympanic membrane and ear  canal normal. No drainage or tenderness. No middle ear effusion. Tympanic membrane is not erythematous.     Left Ear: Tympanic membrane and ear canal normal. No drainage or tenderness.  No middle ear effusion. Tympanic membrane is not erythematous.     Nose: Nose normal. No congestion or rhinorrhea.     Mouth/Throat:     Mouth: Mucous membranes are moist. No oral lesions.     Pharynx: Oropharynx is clear. No pharyngeal swelling, oropharyngeal exudate, posterior oropharyngeal erythema or uvula swelling.     Tonsils: No tonsillar exudate or tonsillar abscesses.  Eyes:     Extraocular Movements: Extraocular movements intact.     Right eye: Normal extraocular motion.     Left eye: Normal extraocular motion.     Conjunctiva/sclera: Conjunctivae normal.     Pupils: Pupils are equal, round, and reactive to  light.  Cardiovascular:     Rate and Rhythm: Normal rate and regular rhythm.     Pulses: Normal pulses.     Heart sounds: Normal heart sounds. No murmur heard.   No friction rub. No gallop.  Pulmonary:     Effort: Pulmonary effort is normal. No respiratory distress.     Breath sounds: Normal breath sounds. No stridor. No wheezing, rhonchi or rales.  Musculoskeletal:     Cervical back: Normal range of motion and neck supple.  Lymphadenopathy:     Cervical: No cervical adenopathy.  Skin:    General: Skin is warm and dry.     Findings: No rash.  Neurological:     General: No focal deficit present.     Mental Status: She is alert and oriented to person, place, and time.  Psychiatric:        Mood and Affect: Mood normal.        Behavior: Behavior normal.        Thought Content: Thought content normal.     Assessment and Plan :   PDMP not reviewed this encounter.  1. Influenza A   2. Productive cough   3. Nasal congestion   4. History of diverticulitis    Patient is outside the window where Tamiflu would be helpful.  Recommended supportive care. Deferred imaging given clear cardiopulmonary exam, hemodynamically stable vital signs. Counseled patient on potential for adverse effects with medications prescribed/recommended today, ER and return-to-clinic precautions discussed, patient verbalized understanding.    Jaynee Eagles, PA-C 12/14/20 1041

## 2020-12-18 ENCOUNTER — Telehealth: Payer: BC Managed Care – PPO | Admitting: Nurse Practitioner

## 2020-12-18 DIAGNOSIS — R058 Other specified cough: Secondary | ICD-10-CM

## 2020-12-18 DIAGNOSIS — J101 Influenza due to other identified influenza virus with other respiratory manifestations: Secondary | ICD-10-CM

## 2020-12-18 MED ORDER — PREDNISONE 10 MG PO TABS: 10.0000 mg | ORAL_TABLET | Freq: Every day | ORAL | 0 refills | Status: DC

## 2020-12-18 NOTE — Patient Instructions (Signed)
  Essence Witter, thank you for joining Gildardo Pounds, NP for today's virtual visit.  While this provider is not your primary care provider (PCP), if your PCP is located in our provider database this encounter information will be shared with them immediately following your visit.  Consent: (Patient) Fairy Meditz provided verbal consent for this virtual visit at the beginning of the encounter.  Current Medications:  Current Outpatient Medications:    predniSONE (DELTASONE) 10 MG tablet, Take 1 tablet (10 mg total) by mouth daily with breakfast., Disp: 6 tablet, Rfl: 0   acetaminophen (TYLENOL) 500 MG tablet, Take 500 mg by mouth daily as needed for moderate pain or headache. , Disp: , Rfl:    albuterol (VENTOLIN HFA) 108 (90 Base) MCG/ACT inhaler, Inhale 1-2 puffs into the lungs every 6 (six) hours as needed., Disp: , Rfl:    Ascorbic Acid (VITA-C PO), Take 1 tablet by mouth daily., Disp: , Rfl:    benzonatate (TESSALON) 100 MG capsule, Take 1-2 capsules (100-200 mg total) by mouth 3 (three) times daily as needed for cough., Disp: 60 capsule, Rfl: 0   cetirizine (ZYRTEC) 10 MG tablet, Take 10 mg by mouth daily as needed for allergies. , Disp: , Rfl:    cholecalciferol (VITAMIN D) 25 MCG (1000 UNIT) tablet, Take 1,000 Units by mouth daily., Disp: , Rfl:    ciprofloxacin (CIPRO) 500 MG tablet, Take 500 mg by mouth 2 (two) times daily., Disp: , Rfl:    Multiple Vitamins-Minerals (MULTIVITAMIN WITH MINERALS) tablet, Take 1 tablet by mouth daily. Woman, Disp: , Rfl:    Probiotic Product (ALIGN) 4 MG CAPS, Take 4 mg by mouth daily., Disp: , Rfl:    promethazine-dextromethorphan (PROMETHAZINE-DM) 6.25-15 MG/5ML syrup, Take 5 mLs by mouth at bedtime as needed for cough., Disp: 100 mL, Rfl: 0   VITAMIN E PO, Take by mouth daily., Disp: , Rfl:    Wheat Dextrin (BENEFIBER) POWD, Take by mouth daily. 2 Tbsp daily, Disp: , Rfl:    zinc gluconate 50 MG tablet, Take 50 mg by mouth daily., Disp: , Rfl:     Medications ordered in this encounter:  Meds ordered this encounter  Medications   predniSONE (DELTASONE) 10 MG tablet    Sig: Take 1 tablet (10 mg total) by mouth daily with breakfast.    Dispense:  6 tablet    Refill:  0    Order Specific Question:   Supervising Provider    Answer:   Sabra Heck, [place]     *If you need refills on other medications prior to your next appointment, please contact your pharmacy*  Follow-Up: Call back or seek an in-person evaluation if the symptoms worsen or if the condition fails to improve as anticipated.   If you have been instructed to have an in-person evaluation today at a local Urgent Care facility, please use the link below. It will take you to a list of all of our available [facility], including address, phone number and hours of operation. Please do not delay care.  [facility]  If you or a family member do not have a primary care provider, use the link below to schedule a visit and establish care. When you choose a [facility] primary care physician or advanced practice provider, you gain a long-term partner in health. Find a Primary Care Provider  Learn more about 's in-office and virtual care options: [facility]

## 2020-12-18 NOTE — Progress Notes (Signed)
Virtual Visit Consent   Connie [name], you are scheduled for a virtual visit with a [facility] provider today.     Just as with appointments in the office, your consent must be obtained to participate.  Your consent will be active for this visit and any virtual visit you may have with one of our providers in the next 365 days.     If you have a MyChart account, a copy of this consent can be sent to you electronically.  All virtual visits are billed to your insurance company just like a traditional visit in the office.    As this is a virtual visit, video technology does not allow for your provider to perform a traditional examination.  This may limit your provider's ability to fully assess your condition.  If your provider identifies any concerns that need to be evaluated in person or the need to arrange testing (such as labs, EKG, etc.), we will make arrangements to do so.     Although advances in technology are sophisticated, we cannot ensure that it will always work on either your end or our end.  If the connection with a video visit is poor, the visit may have to be switched to a telephone visit.  With either a video or telephone visit, we are not always able to ensure that we have a secure connection.     I need to obtain your verbal consent now.   Are you willing to proceed with your visit today?    Connie [name] has provided verbal consent on 12/18/2020 for a virtual visit (video or telephone).   Gildardo Pounds, NP   Date: 12/18/2020 11:52 AM   Virtual Visit via Video Note   I, Gildardo Pounds, connected with  Connie [name]  ([id number], 1990/11/12) on 12/18/20 at 12:00 PM EDT by a video-enabled telemedicine application and verified that I am speaking with the correct person using two identifiers.  Location: Patient: Virtual Visit Location Patient: Home Provider: Virtual Visit Location Provider: Home Office   I discussed the limitations of evaluation and management by  telemedicine and the availability of in person appointments. The patient expressed understanding and agreed to proceed.    History of Present Illness: Connie [name] is a 30 y.o. who identifies as a female who was assigned female at birth, and is being seen today for COUGH and FLU.  HPI:  She was diagnosed with the flu 3 days ago. Unfortunately she was out of the window and tamiflu was not prescribed. Since then despite taking tessalon and promethazine cough syrup she is experiencing a harsh persistent productive cough. Cough is disruptive of her daily routine and also interferes with sleep.     Problems:  Patient Active Problem List   Diagnosis Date Noted   Abnormal CT scan, colon 08/20/2019   IBS (irritable bowel syndrome) 08/20/2019   Dysuria 07/10/2019   Pregnancy 05/03/2018   Liver mass 11/25/2015   Abdominal pain, epigastric 11/25/2015   Lower abdominal pain 11/25/2015   Body mass index between 19-24, adult 06/24/2013    Allergies: No Known Allergies Medications:  Current Outpatient Medications:    predniSONE (DELTASONE) 10 MG tablet, Take 1 tablet (10 mg total) by mouth daily with breakfast., Disp: 6 tablet, Rfl: 0   acetaminophen (TYLENOL) 500 MG tablet, Take 500 mg by mouth daily as needed for moderate pain or headache. , Disp: , Rfl:    albuterol (VENTOLIN HFA) 108 (90 Base) MCG/ACT inhaler, Inhale 1-2 puffs  into the lungs every 6 (six) hours as needed., Disp: , Rfl:    Ascorbic Acid (VITA-C PO), Take 1 tablet by mouth daily., Disp: , Rfl:    benzonatate (TESSALON) 100 MG capsule, Take 1-2 capsules (100-200 mg total) by mouth 3 (three) times daily as needed for cough., Disp: 60 capsule, Rfl: 0   cetirizine (ZYRTEC) 10 MG tablet, Take 10 mg by mouth daily as needed for allergies. , Disp: , Rfl:    cholecalciferol (VITAMIN D) 25 MCG (1000 UNIT) tablet, Take 1,000 Units by mouth daily., Disp: , Rfl:    ciprofloxacin (CIPRO) 500 MG tablet, Take 500 mg by mouth 2 (two) times  daily., Disp: , Rfl:    Multiple Vitamins-Minerals (MULTIVITAMIN WITH MINERALS) tablet, Take 1 tablet by mouth daily. Woman, Disp: , Rfl:    Probiotic Product (ALIGN) 4 MG CAPS, Take 4 mg by mouth daily., Disp: , Rfl:    promethazine-dextromethorphan (PROMETHAZINE-DM) 6.25-15 MG/5ML syrup, Take 5 mLs by mouth at bedtime as needed for cough., Disp: 100 mL, Rfl: 0   VITAMIN E PO, Take by mouth daily., Disp: , Rfl:    Wheat Dextrin (BENEFIBER) POWD, Take by mouth daily. 2 Tbsp daily, Disp: , Rfl:    zinc gluconate 50 MG tablet, Take 50 mg by mouth daily., Disp: , Rfl:   Observations/Objective: Patient is well-developed, well-nourished in no acute distress.  Resting comfortably at home.  Head is normocephalic, atraumatic.  No labored breathing. Noted for coughing episodes during video visit. Cough is productive.  Speech is clear and coherent with logical content.  Patient is alert and oriented at baseline.    Assessment and Plan: 1. Influenza A - predniSONE (DELTASONE) 10 MG tablet; Take 1 tablet (10 mg total) by mouth daily with breakfast.  Dispense: 6 tablet; Refill: 0  2. Productive cough - predniSONE (DELTASONE) 10 MG tablet; Take 1 tablet (10 mg total) by mouth daily with breakfast.  Dispense: 6 tablet; Refill: 0  Continue cough syrup at night and tessalon as needed.   Follow Up Instructions: I discussed the assessment and treatment plan with the patient. The patient was provided an opportunity to ask questions and all were answered. The patient agreed with the plan and demonstrated an understanding of the instructions.  A copy of instructions were sent to the patient via MyChart unless otherwise noted below.     The patient was advised to call back or seek an in-person evaluation if the symptoms worsen or if the condition fails to improve as anticipated.  Time:  I spent 10 minutes with the patient via telehealth technology discussing the above problems/concerns.    Gildardo Pounds, NP

## 2020-12-23 ENCOUNTER — Other Ambulatory Visit: Payer: Self-pay

## 2020-12-23 ENCOUNTER — Encounter: Payer: Self-pay | Admitting: Emergency Medicine

## 2020-12-23 ENCOUNTER — Ambulatory Visit
Admission: EM | Admit: 2020-12-23 | Discharge: 2020-12-23 | Disposition: A | Discharge status: Home / Self Care | Payer: BC Managed Care – PPO | Attending: Urgent Care | Admitting: Urgent Care

## 2020-12-23 DIAGNOSIS — R053 Chronic cough: Secondary | ICD-10-CM

## 2020-12-23 DIAGNOSIS — R058 Other specified cough: Secondary | ICD-10-CM

## 2020-12-23 DIAGNOSIS — J101 Influenza due to other identified influenza virus with other respiratory manifestations: Secondary | ICD-10-CM

## 2020-12-23 MED ORDER — PREDNISONE 10 MG PO TABS: 40.0000 mg | ORAL_TABLET | Freq: Every day | ORAL | 0 refills | Status: DC

## 2020-12-23 NOTE — ED Triage Notes (Signed)
Pt is present today with cough, sore throat, and back pain. Pt states sx started x2 weeks ago.

## 2020-12-23 NOTE — ED Provider Notes (Signed)
[place]   MRN: [id number] DOB: 07-19-1990  Subjective:   Connie [name] is a 30 y.o. female presenting for persistent coughing.  Patient states that she is overall better but the cough is really bothersome.  Tested positive for influenza A 12/14/2020 and had Augmentin from a visit at the end of September for sinusitis.  She has been using her inhaler very consistently, cough capsules as well.  Overall she is shortness to make sure that her lungs are clear.  She did do a video visit where she was prescribed 10 mg of prednisone daily but thinks that she needs more.  No active chest pain or shortness of breath, wheezing.  The cough does hurt her back.  No current facility-administered medications for this encounter.  Current Outpatient Medications:    acetaminophen (TYLENOL) 500 MG tablet, Take 500 mg by mouth daily as needed for moderate pain or headache. , Disp: , Rfl:    albuterol (VENTOLIN HFA) 108 (90 Base) MCG/ACT inhaler, Inhale 1-2 puffs into the lungs every 6 (six) hours as needed., Disp: , Rfl:    Ascorbic Acid (VITA-C PO), Take 1 tablet by mouth daily., Disp: , Rfl:    benzonatate (TESSALON) 100 MG capsule, Take 1-2 capsules (100-200 mg total) by mouth 3 (three) times daily as needed for cough., Disp: 60 capsule, Rfl: 0   cetirizine (ZYRTEC) 10 MG tablet, Take 10 mg by mouth daily as needed for allergies. , Disp: , Rfl:    cholecalciferol (VITAMIN D) 25 MCG (1000 UNIT) tablet, Take 1,000 Units by mouth daily., Disp: , Rfl:    ciprofloxacin (CIPRO) 500 MG tablet, Take 500 mg by mouth 2 (two) times daily., Disp: , Rfl:    Multiple Vitamins-Minerals (MULTIVITAMIN WITH MINERALS) tablet, Take 1 tablet by mouth daily. Woman, Disp: , Rfl:    predniSONE (DELTASONE) 10 MG tablet, Take 1 tablet (10 mg total) by mouth daily with breakfast., Disp: 6 tablet, Rfl: 0   Probiotic Product (ALIGN) 4 MG CAPS, Take 4 mg by mouth daily., Disp: , Rfl:    promethazine-dextromethorphan  (PROMETHAZINE-DM) 6.25-15 MG/5ML syrup, Take 5 mLs by mouth at bedtime as needed for cough., Disp: 100 mL, Rfl: 0   VITAMIN E PO, Take by mouth daily., Disp: , Rfl:    Wheat Dextrin (BENEFIBER) POWD, Take by mouth daily. 2 Tbsp daily, Disp: , Rfl:    zinc gluconate 50 MG tablet, Take 50 mg by mouth daily., Disp: , Rfl:    No Known Allergies  Past Medical History:  Diagnosis Date   Diverticulitis 06/2019   Focal nodular hyperplasia of liver 2017   Stable imaging x4 years.    Hypoglycemic reaction    IBS (irritable bowel syndrome)      Past Surgical History:  Procedure Laterality Date   COLONOSCOPY N/A 12/02/2019   Procedure: COLONOSCOPY;  Surgeon: Daneil Dolin, MD; diverticulosis in the sigmoid and descending colon, otherwise normal exam.  Repeat colonoscopy at age 12.   WISDOM TOOTH EXTRACTION      Family History  Problem Relation Age of Onset   Hypertension Mother    Heart attack Other    Colon cancer Neg Hx    Inflammatory bowel disease Neg Hx     Social History   Tobacco Use   Smoking status: Never   Smokeless tobacco: Never  Vaping Use   Vaping Use: Never used  Substance Use Topics   Alcohol use: No   Drug use: No    ROS  Objective:   Vitals: BP 108/63   Pulse (!) 113   Temp 98.3 F (36.8 C) (Oral)   Resp 19   LMP  (LMP Unknown)   SpO2 98%   Physical Exam Constitutional:      General: She is not in acute distress.    Appearance: Normal appearance. She is well-developed. She is not ill-appearing, toxic-appearing or diaphoretic.  HENT:     Head: Normocephalic and atraumatic.     Nose: Nose normal.     Mouth/Throat:     Mouth: Mucous membranes are moist.     Pharynx: No pharyngeal swelling, oropharyngeal exudate, posterior oropharyngeal erythema or uvula swelling.     Tonsils: No tonsillar exudate or tonsillar abscesses. 0 on the right. 0 on the left.  Eyes:     General: No scleral icterus.    Extraocular Movements: Extraocular movements  intact.     Pupils: Pupils are equal, round, and reactive to light.  Cardiovascular:     Rate and Rhythm: Normal rate and regular rhythm.     Pulses: Normal pulses.     Heart sounds: Normal heart sounds. No murmur heard.   No friction rub. No gallop.  Pulmonary:     Effort: Pulmonary effort is normal. No respiratory distress.     Breath sounds: Normal breath sounds. No stridor. No wheezing, rhonchi or rales.  Skin:    General: Skin is warm and dry.     Findings: No rash.  Neurological:     General: No focal deficit present.     Mental Status: She is alert and oriented to person, place, and time.  Psychiatric:        Mood and Affect: Mood normal.        Behavior: Behavior normal.        Thought Content: Thought content normal.    Assessment and Plan :   PDMP not reviewed this encounter.  1. Persistent cough   2. Influenza A   3. Productive cough    Deferred imaging given clear cardiopulmonary exam, hemodynamically stable vital signs.  Patient was in agreement.  Will defer antibiotic use as there is no sign of bacterial infection.  Recommended an oral prednisone course of 40 mg instead of 10mg .  She was agreeable to this.  Maintain supportive care otherwise. Counseled patient on potential for adverse effects with medications prescribed/recommended today, ER and return-to-clinic precautions discussed, patient verbalized understanding.    Jaynee Eagles, PA-C 12/23/20 1319

## 2020-12-24 ENCOUNTER — Telehealth: Payer: BC Managed Care – PPO | Admitting: Emergency Medicine

## 2020-12-24 DIAGNOSIS — M940 Chondrocostal junction syndrome [Tietze]: Secondary | ICD-10-CM

## 2020-12-24 DIAGNOSIS — R051 Acute cough: Secondary | ICD-10-CM | POA: Diagnosis not present

## 2020-12-24 NOTE — Progress Notes (Signed)
Virtual Visit Consent   Connie [name], you are scheduled for a virtual visit with a [facility] provider today.     Just as with appointments in the office, your consent must be obtained to participate.  Your consent will be active for this visit and any virtual visit you may have with one of our providers in the next 365 days.     If you have a MyChart account, a copy of this consent can be sent to you electronically.  All virtual visits are billed to your insurance company just like a traditional visit in the office.    As this is a virtual visit, video technology does not allow for your provider to perform a traditional examination.  This may limit your provider's ability to fully assess your condition.  If your provider identifies any concerns that need to be evaluated in person or the need to arrange testing (such as labs, EKG, etc.), we will make arrangements to do so.     Although advances in technology are sophisticated, we cannot ensure that it will always work on either your end or our end.  If the connection with a video visit is poor, the visit may have to be switched to a telephone visit.  With either a video or telephone visit, we are not always able to ensure that we have a secure connection.     I need to obtain your verbal consent now.   Are you willing to proceed with your visit today?    Arleta Mczeal has provided verbal consent on 12/24/2020 for a virtual visit (video or telephone).   Lestine Box, [place]   Date: 12/24/2020 10:14 AM   Virtual Visit via Video Note   I, Lestine Box, connected with  Connie [name]  ([id number], 04/20/1990) on 12/24/20 at 10:00 AM EDT by a video-enabled telemedicine application and verified that I am speaking with the correct person using two identifiers.  Location: Patient: Virtual Visit Location Patient: Home Provider: Virtual Visit Location Provider: Home Office   I discussed the limitations of evaluation and management by  telemedicine and the availability of in person appointments. The patient expressed understanding and agreed to proceed.    History of Present Illness: Connie [name] is a 30 y.o. who identifies as a female who was assigned female at birth, and is being seen today for follow up after being diagnosed with type A flu on 10/19.  States she has been taking prednisone, tessalon and inhaler.  Reports coughing fit that occurred last night that resulted in heart burn and some RT sided chest wall discomfort.  Wants to ensure it is ok to take OTC medication for possible pulled muscle.  Has tried TUMS with relief.  States overall she is improving.  Symptoms are made worse with cough.  Reports previous symptoms in the past with flu.   Denies SOB, wheezing, chest pain, nausea, changes in bowel or bladder habits.    ROS: As per HPI.  All other pertinent ROS negative.     HPI: HPI  Problems:  Patient Active Problem List   Diagnosis Date Noted   Abnormal CT scan, colon 08/20/2019   IBS (irritable bowel syndrome) 08/20/2019   Dysuria 07/10/2019   Pregnancy 05/03/2018   Liver mass 11/25/2015   Abdominal pain, epigastric 11/25/2015   Lower abdominal pain 11/25/2015   Body mass index between 19-24, adult 06/24/2013    Allergies: No Known Allergies Medications:  Current Outpatient Medications:    acetaminophen (TYLENOL) 500  MG tablet, Take 500 mg by mouth daily as needed for moderate pain or headache. , Disp: , Rfl:    albuterol (VENTOLIN HFA) 108 (90 Base) MCG/ACT inhaler, Inhale 1-2 puffs into the lungs every 6 (six) hours as needed., Disp: , Rfl:    Ascorbic Acid (VITA-C PO), Take 1 tablet by mouth daily., Disp: , Rfl:    benzonatate (TESSALON) 100 MG capsule, Take 1-2 capsules (100-200 mg total) by mouth 3 (three) times daily as needed for cough., Disp: 60 capsule, Rfl: 0   cetirizine (ZYRTEC) 10 MG tablet, Take 10 mg by mouth daily as needed for allergies. , Disp: , Rfl:    cholecalciferol (VITAMIN D) 25  MCG (1000 UNIT) tablet, Take 1,000 Units by mouth daily., Disp: , Rfl:    ciprofloxacin (CIPRO) 500 MG tablet, Take 500 mg by mouth 2 (two) times daily., Disp: , Rfl:    Multiple Vitamins-Minerals (MULTIVITAMIN WITH MINERALS) tablet, Take 1 tablet by mouth daily. Woman, Disp: , Rfl:    predniSONE (DELTASONE) 10 MG tablet, Take 4 tablets (40 mg total) by mouth daily with breakfast., Disp: 20 tablet, Rfl: 0   Probiotic Product (ALIGN) 4 MG CAPS, Take 4 mg by mouth daily., Disp: , Rfl:    promethazine-dextromethorphan (PROMETHAZINE-DM) 6.25-15 MG/5ML syrup, Take 5 mLs by mouth at bedtime as needed for cough., Disp: 100 mL, Rfl: 0   VITAMIN E PO, Take by mouth daily., Disp: , Rfl:    Wheat Dextrin (BENEFIBER) POWD, Take by mouth daily. 2 Tbsp daily, Disp: , Rfl:    zinc gluconate 50 MG tablet, Take 50 mg by mouth daily., Disp: , Rfl:   Observations/Objective: Patient is well-developed, well-nourished in no acute distress.  Resting comfortably at home.  Head is normocephalic, atraumatic.  No labored breathing. Speaking in full sentences and tolerating own secretions.  Cough absent Speech is clear and coherent with logical content.  Patient is alert and oriented at baseline.   Assessment and Plan: 1. Acute cough  2. Costochondritis  Continue with prescribed medications.  Prednisone, tessalon, and albuterol inhaler should help with cough.  Prednisone may help with pain associated with cough.   You may take ibuprofen and/or tylenol in between doses of prednisone to help with pain Get plenty of rest and push fluids Use OTC zyrtec for nasal congestion, runny nose, and/or sore throat Use OTC flonase for nasal congestion and runny nose Follow up in person with urgent care or go to the ED if you have any new or worsening symptoms such as fever, worsening cough, shortness of breath, chest tightness, chest pain, turning blue, changes in mental status, etc...   Follow Up Instructions: I discussed the  assessment and treatment plan with the patient. The patient was provided an opportunity to ask questions and all were answered. The patient agreed with the plan and demonstrated an understanding of the instructions.  A copy of instructions were sent to the patient via MyChart unless otherwise noted below.     The patient was advised to call back or seek an in-person evaluation if the symptoms worsen or if the condition fails to improve as anticipated.  Time:  I spent 10 minutes with the patient via telehealth technology discussing the above problems/concerns.    Lestine Box, PA-C

## 2020-12-24 NOTE — Patient Instructions (Signed)
Ammanda Boulden, thank you for joining [place], PA-C for today's virtual visit.  While this provider is not your primary care provider (PCP), if your PCP is located in our provider database this encounter information will be shared with them immediately following your visit.  Consent: (Patient) Palma Snoke provided verbal consent for this virtual visit at the beginning of the encounter.  Current Medications:  Current Outpatient Medications:    acetaminophen (TYLENOL) 500 MG tablet, Take 500 mg by mouth daily as needed for moderate pain or headache. , Disp: , Rfl:    albuterol (VENTOLIN HFA) 108 (90 Base) MCG/ACT inhaler, Inhale 1-2 puffs into the lungs every 6 (six) hours as needed., Disp: , Rfl:    Ascorbic Acid (VITA-C PO), Take 1 tablet by mouth daily., Disp: , Rfl:    benzonatate (TESSALON) 100 MG capsule, Take 1-2 capsules (100-200 mg total) by mouth 3 (three) times daily as needed for cough., Disp: 60 capsule, Rfl: 0   cetirizine (ZYRTEC) 10 MG tablet, Take 10 mg by mouth daily as needed for allergies. , Disp: , Rfl:    cholecalciferol (VITAMIN D) 25 MCG (1000 UNIT) tablet, Take 1,000 Units by mouth daily., Disp: , Rfl:    ciprofloxacin (CIPRO) 500 MG tablet, Take 500 mg by mouth 2 (two) times daily., Disp: , Rfl:    Multiple Vitamins-Minerals (MULTIVITAMIN WITH MINERALS) tablet, Take 1 tablet by mouth daily. Woman, Disp: , Rfl:    predniSONE (DELTASONE) 10 MG tablet, Take 4 tablets (40 mg total) by mouth daily with breakfast., Disp: 20 tablet, Rfl: 0   Probiotic Product (ALIGN) 4 MG CAPS, Take 4 mg by mouth daily., Disp: , Rfl:    promethazine-dextromethorphan (PROMETHAZINE-DM) 6.25-15 MG/5ML syrup, Take 5 mLs by mouth at bedtime as needed for cough., Disp: 100 mL, Rfl: 0   VITAMIN E PO, Take by mouth daily., Disp: , Rfl:    Wheat Dextrin (BENEFIBER) POWD, Take by mouth daily. 2 Tbsp daily, Disp: , Rfl:    zinc gluconate 50 MG tablet, Take 50 mg by mouth daily., Disp: , Rfl:     Medications ordered in this encounter:  No orders of the defined types were placed in this encounter.    *If you need refills on other medications prior to your next appointment, please contact your pharmacy*  Follow-Up: Call back or seek an in-person evaluation if the symptoms worsen or if the condition fails to improve as anticipated.  Other Instructions Continue with prescribed medications.  Prednisone, tessalon, and albuterol inhaler should help with cough.  Prednisone may help with pain associated with cough.   You may take ibuprofen and/or tylenol in between doses of prednisone to help with pain Get plenty of rest and push fluids Use OTC zyrtec for nasal congestion, runny nose, and/or sore throat Use OTC flonase for nasal congestion and runny nose Follow up in person with urgent care or go to the ED if you have any new or worsening symptoms such as fever, worsening cough, shortness of breath, chest tightness, chest pain, turning blue, changes in mental status, etc...    If you have been instructed to have an in-person evaluation today at a local Urgent Care facility, please use the link below. It will take you to a list of all of our available [facility], including address, phone number and hours of operation. Please do not delay care.  [facility]  If you or a family member do not have a primary care  provider, use the link below to schedule a visit and establish care. When you choose a [facility] primary care physician or advanced practice provider, you gain a long-term partner in health. Find a Primary Care Provider  Learn more about [facility]'s in-office and virtual care options: [facility]

## 2020-12-28 DIAGNOSIS — J069 Acute upper respiratory infection, unspecified: Secondary | ICD-10-CM | POA: Diagnosis not present

## 2020-12-28 DIAGNOSIS — K588 Other irritable bowel syndrome: Secondary | ICD-10-CM | POA: Diagnosis not present

## 2020-12-28 DIAGNOSIS — K5732 Diverticulitis of large intestine without perforation or abscess without bleeding: Secondary | ICD-10-CM | POA: Diagnosis not present

## 2021-01-02 ENCOUNTER — Other Ambulatory Visit (HOSPITAL_COMMUNITY): Payer: Self-pay | Admitting: Physician Assistant

## 2021-01-02 ENCOUNTER — Other Ambulatory Visit: Payer: Self-pay

## 2021-01-02 ENCOUNTER — Ambulatory Visit (HOSPITAL_COMMUNITY)
Admission: RE | Admit: 2021-01-02 | Discharge: 2021-01-02 | Disposition: A | Discharge status: Home / Self Care | Payer: BC Managed Care – PPO | Source: Ambulatory Visit | Attending: Physician Assistant | Admitting: Physician Assistant

## 2021-01-02 DIAGNOSIS — R0781 Pleurodynia: Secondary | ICD-10-CM | POA: Diagnosis not present

## 2021-01-02 DIAGNOSIS — J101 Influenza due to other identified influenza virus with other respiratory manifestations: Secondary | ICD-10-CM | POA: Diagnosis not present

## 2021-01-02 DIAGNOSIS — R059 Cough, unspecified: Secondary | ICD-10-CM | POA: Diagnosis not present

## 2021-01-03 DIAGNOSIS — J101 Influenza due to other identified influenza virus with other respiratory manifestations: Secondary | ICD-10-CM | POA: Diagnosis not present

## 2021-01-03 DIAGNOSIS — R059 Cough, unspecified: Secondary | ICD-10-CM | POA: Diagnosis not present

## 2021-01-24 DIAGNOSIS — Z6822 Body mass index (BMI) 22.0-22.9, adult: Secondary | ICD-10-CM | POA: Diagnosis not present

## 2021-01-24 DIAGNOSIS — M79602 Pain in left arm: Secondary | ICD-10-CM | POA: Diagnosis not present

## 2021-01-24 DIAGNOSIS — E663 Overweight: Secondary | ICD-10-CM | POA: Diagnosis not present

## 2021-01-24 DIAGNOSIS — R079 Chest pain, unspecified: Secondary | ICD-10-CM | POA: Diagnosis not present

## 2021-02-09 DIAGNOSIS — S233XXA Sprain of ligaments of thoracic spine, initial encounter: Secondary | ICD-10-CM | POA: Diagnosis not present

## 2021-02-09 DIAGNOSIS — S134XXA Sprain of ligaments of cervical spine, initial encounter: Secondary | ICD-10-CM | POA: Diagnosis not present

## 2021-02-09 DIAGNOSIS — S335XXA Sprain of ligaments of lumbar spine, initial encounter: Secondary | ICD-10-CM | POA: Diagnosis not present

## 2021-02-20 IMAGING — CT CT ABD-PELV W/ CM
2 of 4 series · 15 of 46 positions shown, 17 images · IV contrast (omnipaque)
Comparison: CT scan of the abdomen dated 11/24/2015 and abdominal
MRI dated 04/04/2016

CLINICAL DATA: Left lower quadrant pain and nausea.

EXAM:
CT ABDOMEN AND PELVIS WITH CONTRAST
TECHNIQUE: Multidetector CT imaging of the abdomen and pelvis was performed
using the standard protocol following bolus administration of
intravenous contrast.
CONTRAST:  100mL OMNIPAQUE IOHEXOL 300 MG/ML  SOLN

[Series 2: axial st · axial · 0.62mm/px · z∈[+907,+1297]mm · 12 of 90 slices shown, 14 images]
[im 8/90  soft-tissue]
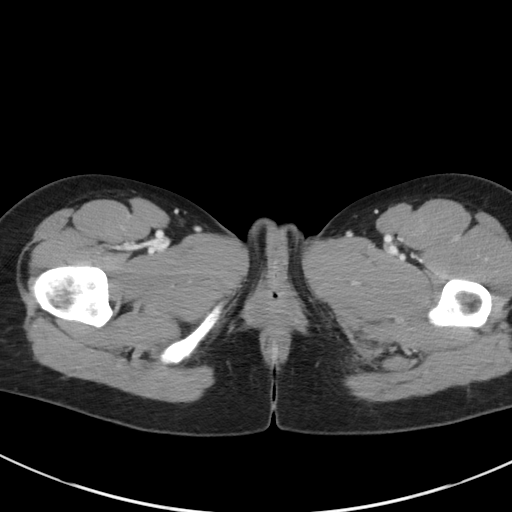
[im 8/90  bone]
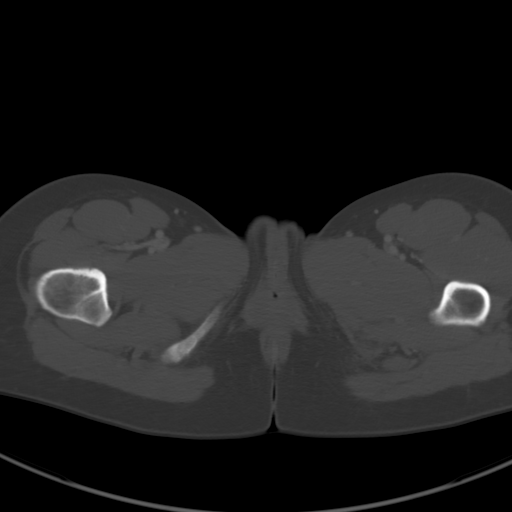
[im 15/90  soft-tissue]
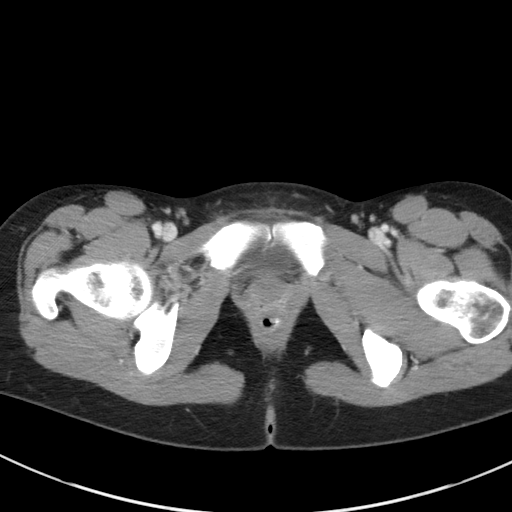
[im 22/90  soft-tissue]
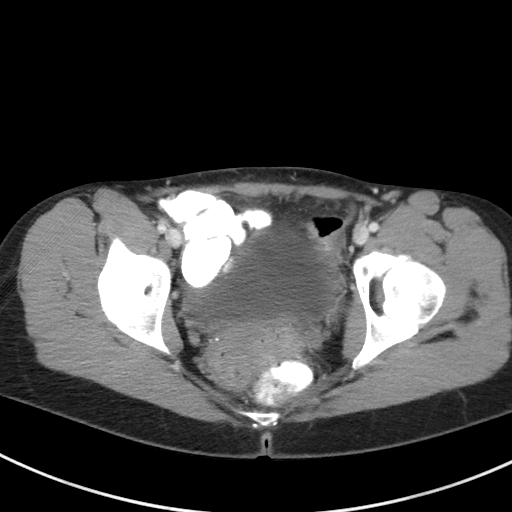
[im 29/90  soft-tissue]
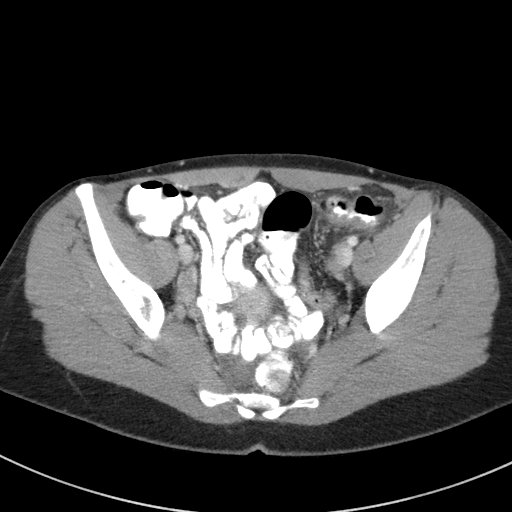
[im 36/90  soft-tissue]
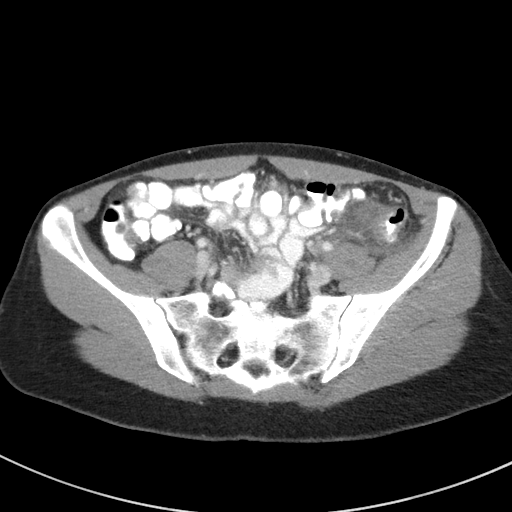
[im 43/90  soft-tissue]
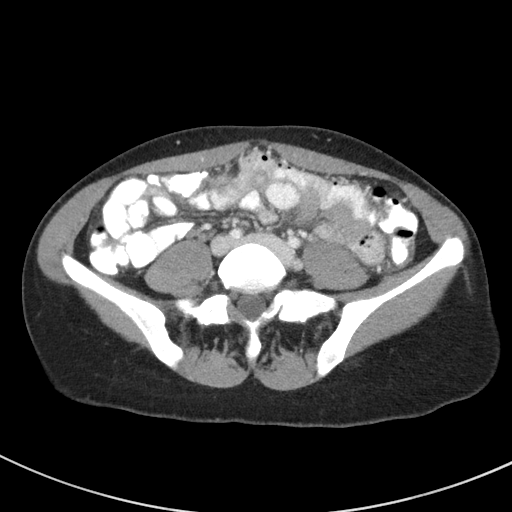
[im 50/90  soft-tissue]
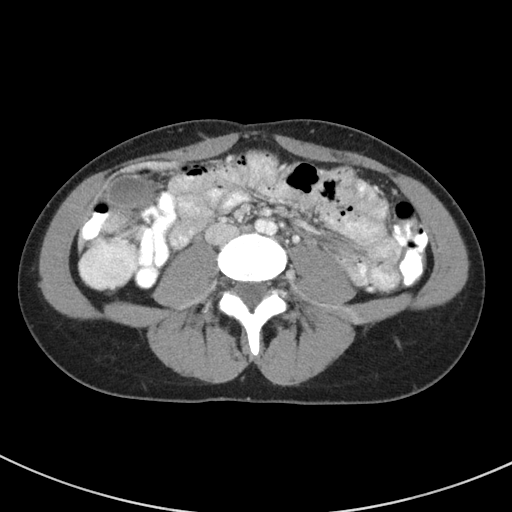
[im 57/90  soft-tissue]
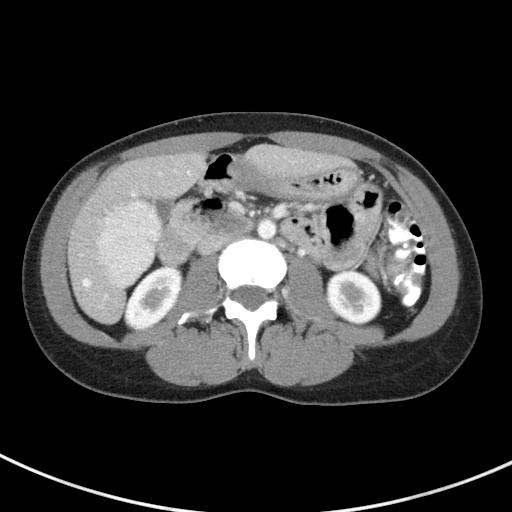
[im 65/90  soft-tissue]
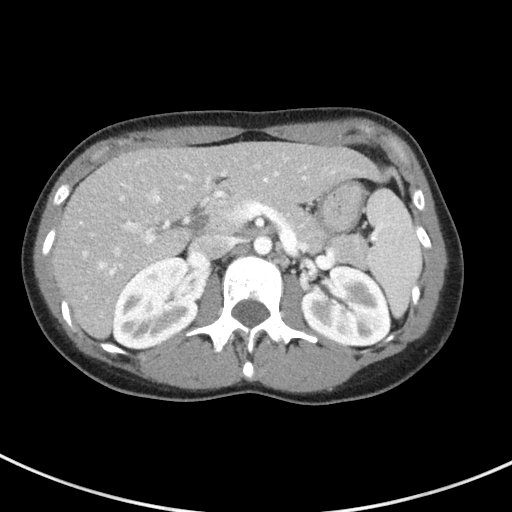
[im 65/90  bone]
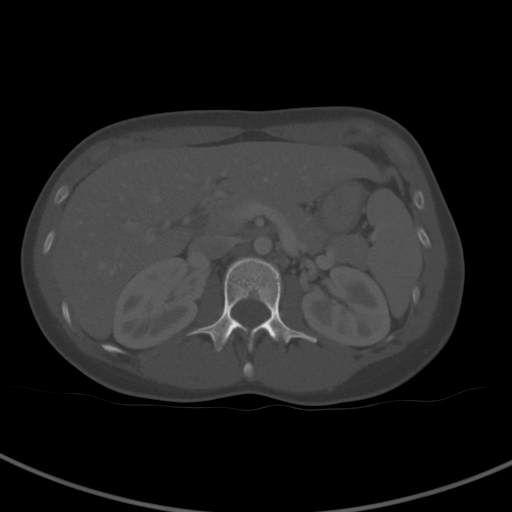
[im 72/90  soft-tissue]
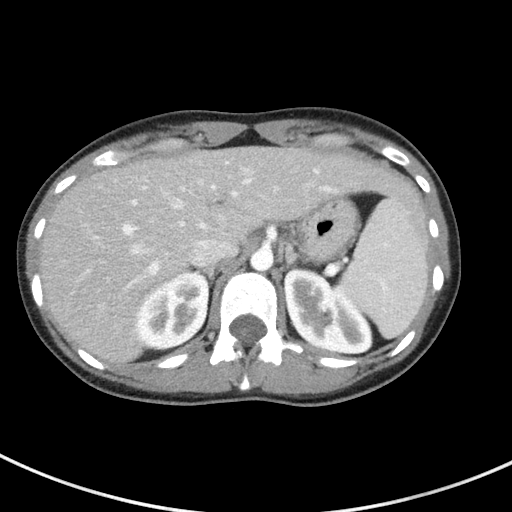
[im 79/90  soft-tissue]
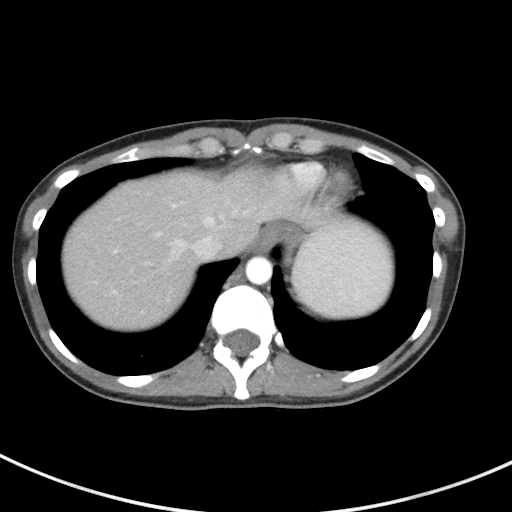
[im 86/90  soft-tissue]
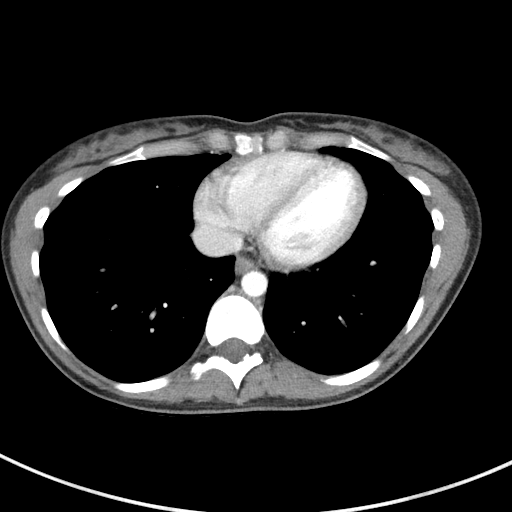

[Series 5: coronal st · coronal · 0.60mm/px · 3 of 75 slices shown]
[im 25/75  soft-tissue]
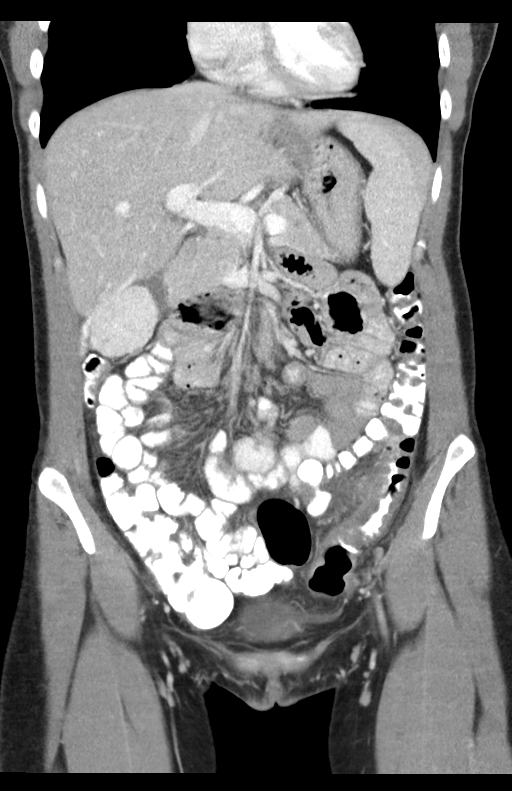
[im 33/75  soft-tissue]
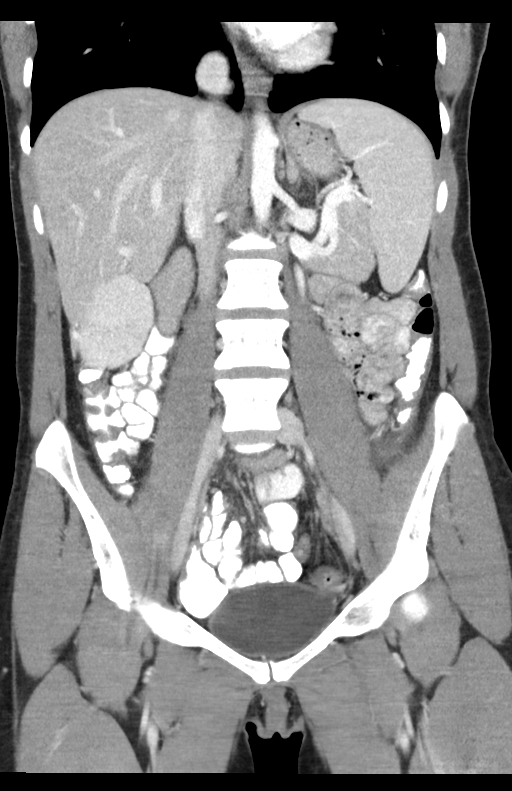
[im 42/75  soft-tissue]
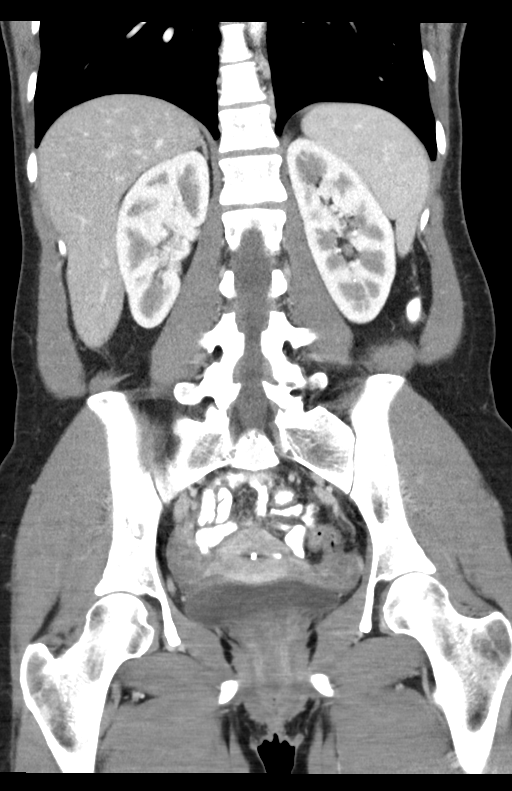

[15 of 46 positions shown; findings below may reference images not displayed]

FINDINGS: Lower chest: Normal.

Hepatobiliary: 5.8 x 5.5 x 4.3 cm exophytic nodule of focal nodular
hyperplasia at the inferior aspect of the right lobe of the liver
with a slight mass effect upon the adjacent gallbladder. The lesion
is slightly smaller than on the prior CT scan. This has been
previously evaluated abdominal MRI dated 04/04/2016. No gallstones,
gallbladder wall thickening, or biliary dilatation.

Pancreas: Unremarkable. No pancreatic ductal dilatation or
surrounding inflammatory changes.

Spleen: Normal in size without focal abnormality.

Adrenals/Urinary Tract: Adrenal glands are unremarkable. Kidneys are
normal, without renal calculi, focal lesion, or hydronephrosis.
Bladder is unremarkable.

Stomach/Bowel: There is prominent mucosal edema in the descending
and proximal sigmoid portions of the colon with pericolonic soft
tissue stranding. This is most severe in the left lower quadrant.
There are rare diverticula in the distal colon. There is an area of
haziness along the medial border of the descending colon on image 24
of series 5 in on image 70 of series 6 which could possibly be
inflamed diverticulum. No discrete abscess or free air. The
remainder of the bowel appears normal including the terminal ileum
and appendix.

Vascular/Lymphatic: No significant vascular findings are present. No
enlarged abdominal or pelvic lymph nodes.

Reproductive: IUD in place. Uterus and ovaries are otherwise normal.

Other: Tiny amount of free fluid in the cul-de-sac, normal for a
female of this age. No abdominal hernias.

Musculoskeletal: No acute or significant osseous findings.
IMPRESSION: 1. Prominent mucosal edema in the descending and proximal sigmoid
portions of the colon with pericolonic soft tissue stranding. This
is most severe in the left lower quadrant. This could represent
colitis or less likely acute diverticulitis.
2. Slight decrease in the size of the exophytic nodule of focal
nodular hyperplasia at the inferior aspect of the right lobe of the
liver.

## 2021-02-21 DIAGNOSIS — S335XXA Sprain of ligaments of lumbar spine, initial encounter: Secondary | ICD-10-CM | POA: Diagnosis not present

## 2021-02-21 DIAGNOSIS — S233XXA Sprain of ligaments of thoracic spine, initial encounter: Secondary | ICD-10-CM | POA: Diagnosis not present

## 2021-02-21 DIAGNOSIS — S134XXA Sprain of ligaments of cervical spine, initial encounter: Secondary | ICD-10-CM | POA: Diagnosis not present

## 2021-03-28 DIAGNOSIS — M25512 Pain in left shoulder: Secondary | ICD-10-CM | POA: Diagnosis not present

## 2021-03-28 DIAGNOSIS — Z6821 Body mass index (BMI) 21.0-21.9, adult: Secondary | ICD-10-CM | POA: Diagnosis not present

## 2021-03-28 DIAGNOSIS — M7522 Bicipital tendinitis, left shoulder: Secondary | ICD-10-CM | POA: Diagnosis not present

## 2021-03-28 DIAGNOSIS — M67912 Unspecified disorder of synovium and tendon, left shoulder: Secondary | ICD-10-CM | POA: Diagnosis not present

## 2021-04-19 DIAGNOSIS — M62838 Other muscle spasm: Secondary | ICD-10-CM | POA: Diagnosis not present

## 2021-04-19 DIAGNOSIS — M353 Polymyalgia rheumatica: Secondary | ICD-10-CM | POA: Diagnosis not present

## 2021-04-19 DIAGNOSIS — Z6821 Body mass index (BMI) 21.0-21.9, adult: Secondary | ICD-10-CM | POA: Diagnosis not present

## 2021-05-12 DIAGNOSIS — Z6821 Body mass index (BMI) 21.0-21.9, adult: Secondary | ICD-10-CM | POA: Diagnosis not present

## 2021-05-12 DIAGNOSIS — K588 Other irritable bowel syndrome: Secondary | ICD-10-CM | POA: Diagnosis not present

## 2021-05-12 DIAGNOSIS — K5732 Diverticulitis of large intestine without perforation or abscess without bleeding: Secondary | ICD-10-CM | POA: Diagnosis not present

## 2021-05-12 DIAGNOSIS — R1030 Lower abdominal pain, unspecified: Secondary | ICD-10-CM | POA: Diagnosis not present

## 2021-05-12 DIAGNOSIS — R300 Dysuria: Secondary | ICD-10-CM | POA: Diagnosis not present

## 2021-05-19 DIAGNOSIS — R1030 Lower abdominal pain, unspecified: Secondary | ICD-10-CM | POA: Diagnosis not present

## 2021-05-21 ENCOUNTER — Other Ambulatory Visit (HOSPITAL_COMMUNITY): Payer: Self-pay | Admitting: Family Medicine

## 2021-05-21 DIAGNOSIS — R1030 Lower abdominal pain, unspecified: Secondary | ICD-10-CM

## 2021-05-21 DIAGNOSIS — R1011 Right upper quadrant pain: Secondary | ICD-10-CM

## 2021-06-01 ENCOUNTER — Ambulatory Visit (HOSPITAL_COMMUNITY)
Admission: RE | Admit: 2021-06-01 | Discharge: 2021-06-01 | Disposition: A | Discharge status: Home / Self Care | Payer: BC Managed Care – PPO | Source: Ambulatory Visit | Attending: Family Medicine | Admitting: Family Medicine

## 2021-06-01 DIAGNOSIS — R1030 Lower abdominal pain, unspecified: Secondary | ICD-10-CM | POA: Diagnosis not present

## 2021-06-01 DIAGNOSIS — R109 Unspecified abdominal pain: Secondary | ICD-10-CM | POA: Diagnosis not present

## 2021-06-01 DIAGNOSIS — R1011 Right upper quadrant pain: Secondary | ICD-10-CM | POA: Diagnosis not present

## 2021-06-08 DIAGNOSIS — S134XXA Sprain of ligaments of cervical spine, initial encounter: Secondary | ICD-10-CM | POA: Diagnosis not present

## 2021-06-08 DIAGNOSIS — S335XXA Sprain of ligaments of lumbar spine, initial encounter: Secondary | ICD-10-CM | POA: Diagnosis not present

## 2021-06-08 DIAGNOSIS — S233XXA Sprain of ligaments of thoracic spine, initial encounter: Secondary | ICD-10-CM | POA: Diagnosis not present

## 2021-06-16 ENCOUNTER — Ambulatory Visit (INDEPENDENT_AMBULATORY_CARE_PROVIDER_SITE_OTHER): Payer: BC Managed Care – PPO | Admitting: Internal Medicine

## 2021-06-16 ENCOUNTER — Other Ambulatory Visit: Payer: Self-pay

## 2021-06-16 ENCOUNTER — Encounter: Payer: Self-pay | Admitting: Internal Medicine

## 2021-06-16 ENCOUNTER — Telehealth: Payer: Self-pay

## 2021-06-16 VITALS — BP 110/70 | HR 73 | Temp 97.7°F | Ht 62.0 in | Wt 116.0 lb

## 2021-06-16 DIAGNOSIS — R160 Hepatomegaly, not elsewhere classified: Secondary | ICD-10-CM | POA: Diagnosis not present

## 2021-06-16 DIAGNOSIS — R1030 Lower abdominal pain, unspecified: Secondary | ICD-10-CM | POA: Diagnosis not present

## 2021-06-16 DIAGNOSIS — R1011 Right upper quadrant pain: Secondary | ICD-10-CM

## 2021-06-16 DIAGNOSIS — R933 Abnormal findings on diagnostic imaging of other parts of digestive tract: Secondary | ICD-10-CM

## 2021-06-16 DIAGNOSIS — R1031 Right lower quadrant pain: Secondary | ICD-10-CM

## 2021-06-16 NOTE — Telephone Encounter (Signed)
PA for CT abd/pelvis w/contrast submitted via [employer]. Order ID: [id number], valid 06/16/21-07/15/21. ?

## 2021-06-16 NOTE — Patient Instructions (Signed)
It was good to see you again today! ? ?To further evaluate your GI symptoms I think it would be best to go ahead and do a CT of the abdomen and pelvis as discussed ? ?Take your fiber supplement every day.  Go with psyllium.  Need a dose that she can tolerate.  I want to emphasize psyllium is best taken every day whether you feel like you need it or not ? ?Keep your GYN appointment coming up ? ?Office visit here in 3 months ? ?Further recommendations to follow. ?

## 2021-06-16 NOTE — Progress Notes (Signed)
? ? ?Primary Care Physician:  [place] ?Primary Gastroenterologist:  Dr. Gala Romney ? ?Pre-Procedure History & Physical: ?HPI:  Connie [name] is a 31 y.o. female here for evaluation of right-sided abdominal pain; she tells me she has right upper quadrant abdominal pain sometimes only when she overeats.  Sometimes a positional component.  She has significant scoliosis. ? ?Right mid / right lower quadrant abdominal pain at various times at least 4 episodes in the past couple of years.  PCP provided metronidazole and ciprofloxacin at home to be taken "as needed" ? ?No diarrhea or constipation.  Denies rectal bleeding.  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 ? ?Has been stable over period of years on CT,   MRI. ? ?Recent abdominal ultrasound (excluding pelvis) reportedly negative aside from stable left hepatic lobe mass. ? ?Isolated AST elevation going back over a decade. ? ?Recent labs from Dr. Nolon Rod office LFTs all normal but  AST 51. ? ?CBC normal.                                                      ? ?Colonoscopy 2 years ago demonstrated left-sided diverticulosis. ? ?Patient became tearful during the interview.  States she felt the healthcare system has let her down.  She has sought out a [place] herbalist here in town.  She takes multiple vitamin supplements as well is a [place] remedy called Shijit to enhance absorption of nutrients from her GI tract.  Also takes a probiotic. ? ?Uses fiber supplement/Benefiber on a as needed basis for bowel function. ? ?She is due to see her GYN next month for physical examination. ? ?Past Medical History:  ?Diagnosis Date  ? Diverticulitis 06/2019  ? Focal nodular hyperplasia of liver 2017  ? Stable imaging x4 years.   ? Hypoglycemic reaction   ? IBS (irritable bowel syndrome)   ? ? ?Past Surgical History:  ?Procedure Laterality Date  ? COLONOSCOPY N/A 12/02/2019  ?  Procedure: COLONOSCOPY;  Surgeon: Daneil Dolin, MD; diverticulosis in the sigmoid and descending colon, otherwise normal exam.  Repeat colonoscopy at age 48.  ? WISDOM TOOTH EXTRACTION    ? ? ?Prior to Admission medications   ?Medication Sig Start Date End Date Taking? Authorizing Provider  ?acetaminophen (TYLENOL) 500 MG tablet Take 500 mg by mouth daily as needed for moderate pain or headache.    Yes [provider]  ?albuterol (VENTOLIN HFA) 108 (90 Base) MCG/ACT inhaler Inhale 1-2 puffs into the lungs every 6 (six) hours as needed. 11/09/19  Yes [provider]  ?cetirizine (ZYRTEC) 10 MG tablet Take 10 mg by mouth daily as needed for allergies.    Yes [provider]  ?cholecalciferol (VITAMIN D) 25 MCG (1000 UNIT) tablet Take 1,000 Units by mouth daily.   Yes [provider]  ?MAGNESIUM CITRATE PO Take 330 mg by mouth. Two gummies daily   Yes [provider]  ?Multiple Vitamins-Minerals (MULTIVITAMIN WITH MINERALS) tablet Take 1 tablet by mouth daily. Woman   Yes [provider]  ?Wheat Dextrin (BENEFIBER) POWD Take by mouth daily. 2 Tbsp daily   Yes [provider]  ? ? ?Allergies as of 06/16/2021  ? (No Known Allergies)  ? ? ?Family History  ?Problem  Relation Age of Onset  ? Hypertension Mother   ? Heart attack Other   ? Colon cancer Neg Hx   ? Inflammatory bowel disease Neg Hx   ? ? ?Social History  ? ?Socioeconomic History  ? Marital status: Married  ?  Spouse name: Not on file  ? Number of children: Not on file  ? Years of education: Not on file  ? Highest education level: Not on file  ?Occupational History  ? Not on file  ?Tobacco Use  ? Smoking status: Never  ? Smokeless tobacco: Never  ?Vaping Use  ? Vaping Use: Never used  ?Substance and Sexual Activity  ? Alcohol use: No  ? Drug use: No  ? Sexual activity: Yes  ?  Birth control/protection: None  ?Other Topics Concern  ? Not on file  ?Social History Narrative  ? Not on file  ? ?Social  Determinants of Health  ? ?Financial Resource Strain: Not on file  ?Food Insecurity: Not on file  ?Transportation Needs: Not on file  ?Physical Activity: Not on file  ?Stress: Not on file  ?Social Connections: Not on file  ?Intimate Partner Violence: Not on file  ? ? ?Review of Systems: ?See HPI, otherwise negative ROS ? ?Physical Exam: ?BP 110/70 (BP Location: Right Arm, Patient Position: Sitting, Cuff Size: Normal)   Pulse 73   Temp 97.7 ?F (36.5 ?C) (Temporal)   Ht '5\' 2"'$  (1.575 m)   Wt 116 lb (52.6 kg)   LMP  (LMP Unknown) Comment: over a year due to IUD  SpO2 100%   BMI 21.22 kg/m?  ?General:   Alert, anxious tearful., pleasant and cooperative in NAD ?Nose:  No deformity, discharge,  or lesions. ?Mouth:  No deformity or lesions. ?Neck:  Supple; no masses or thyromegaly. No significant cervical adenopathy. ?Lungs:  Clear throughout to auscultation.   No wheezes, crackles, or rhonchi. No acute distress. ?Heart:  Regular rate and rhythm; no murmurs, clicks, rubs,  or gallops. ?Abdomen: She has marked her abdominal wall with X's to highlight areas of discomfort.   ?Nondistended.  Positive positive bowel sounds.  She does have some right upper quadrant tenderness to deep palpation just under the right mid axillary line.  I do not appreciate a mass.  Also vague right lower quadrant tenderness. ?Non-distended, normal bowel sounds.   ?Pulses:  Normal pulses noted. ?Extremities:  Without clubbing or edema. ? ?Impression/Plan: Very pleasant 31 year old lady with interesting right-sided abdominal pain with varying components. ? ?CT evidence of diverticulitis 2 years ago.  Patient cites at [facility] 4 episodes of pain attributable to diverticulitis (both left-sided and right-sided for which she has taken Cipro and Flagyl which she has had on hand at home.  She reports temporal improvement in symptoms with this regimen. ? ?Right-sided abdominal pain has almost a musculoskeletal component yet there is a visceral component  to her right lower quadrant abdominal pain. ? ?Even though [place] is a benign lesion, imaging studies has shown  compression of the gallbladder to some degree.  It is unclear whether this has anything to do with her symptoms or not. ? ?Colonoscopy findings from 2 years ago reassuring. ? ?She needs optimization of her fiber regimen. ? ?Discussed the importance of taking it on a regular basis and not sporadically. ? ?Isolated AST over the years-not specific for liver; other hepatic parameters normal. ? ?Her use of over-the-counter vitamin agents and more recently herbal remedies is of some  concern.  She tells me she seems  to feel better taking these agents. ? ?She has quite a bit of free-floating anxiety. ? ? ?Recommendations: ? ?To further evaluate your GI symptoms I think it would be best to go ahead and do a CT of the abdomen and pelvis as discussed ? ?Take your fiber supplement every day.  Go with psyllium.  Need a dose that she can tolerate.  I want to emphasize psyllium is best taken every day whether you feel like you need it or not ? ?Keep your GYN appointment coming up ? ?Office visit here in 3 months ? ?Further recommendations to follow. ? ? ? ? ? ?Notice: This dictation was prepared with Dragon dictation along with smaller phrase technology. Any transcriptional errors that result from this process are unintentional and may not be corrected upon review.  ?

## 2021-06-21 DIAGNOSIS — R1011 Right upper quadrant pain: Secondary | ICD-10-CM | POA: Diagnosis not present

## 2021-06-21 DIAGNOSIS — R160 Hepatomegaly, not elsewhere classified: Secondary | ICD-10-CM | POA: Diagnosis not present

## 2021-06-21 DIAGNOSIS — R1031 Right lower quadrant pain: Secondary | ICD-10-CM | POA: Diagnosis not present

## 2021-06-22 ENCOUNTER — Encounter (HOSPITAL_BASED_OUTPATIENT_CLINIC_OR_DEPARTMENT_OTHER): Payer: Self-pay

## 2021-06-22 LAB — PREGNANCY, URINE: Preg Test, Ur: NEGATIVE

## 2021-06-23 ENCOUNTER — Ambulatory Visit (HOSPITAL_BASED_OUTPATIENT_CLINIC_OR_DEPARTMENT_OTHER)
Admission: RE | Admit: 2021-06-23 | Discharge: 2021-06-23 | Disposition: A | Discharge status: Home / Self Care | Payer: BC Managed Care – PPO | Source: Ambulatory Visit | Attending: Internal Medicine | Admitting: Internal Medicine

## 2021-06-23 DIAGNOSIS — R109 Unspecified abdominal pain: Secondary | ICD-10-CM | POA: Diagnosis not present

## 2021-06-23 DIAGNOSIS — R1011 Right upper quadrant pain: Secondary | ICD-10-CM | POA: Diagnosis not present

## 2021-06-23 DIAGNOSIS — R160 Hepatomegaly, not elsewhere classified: Secondary | ICD-10-CM | POA: Diagnosis not present

## 2021-06-23 DIAGNOSIS — R1031 Right lower quadrant pain: Secondary | ICD-10-CM | POA: Diagnosis not present

## 2021-06-23 MED ORDER — IOHEXOL 300 MG/ML  SOLN
100.0000 mL | Freq: Once | INTRAMUSCULAR | Status: AC | PRN
  Administered 2021-06-23: 80 mL via INTRAVENOUS

## 2021-07-10 DIAGNOSIS — K4090 Unilateral inguinal hernia, without obstruction or gangrene, not specified as recurrent: Secondary | ICD-10-CM | POA: Diagnosis not present

## 2021-07-10 DIAGNOSIS — Z01419 Encounter for gynecological examination (general) (routine) without abnormal findings: Secondary | ICD-10-CM | POA: Diagnosis not present

## 2021-07-10 DIAGNOSIS — Z6820 Body mass index (BMI) 20.0-20.9, adult: Secondary | ICD-10-CM | POA: Diagnosis not present

## 2021-07-10 DIAGNOSIS — Z124 Encounter for screening for malignant neoplasm of cervix: Secondary | ICD-10-CM | POA: Diagnosis not present

## 2021-07-26 DIAGNOSIS — R87610 Atypical squamous cells of undetermined significance on cytologic smear of cervix (ASC-US): Secondary | ICD-10-CM | POA: Diagnosis not present

## 2021-07-26 DIAGNOSIS — N870 Mild cervical dysplasia: Secondary | ICD-10-CM | POA: Diagnosis not present

## 2021-07-27 ENCOUNTER — Inpatient Hospital Stay: Payer: BC Managed Care – PPO | Admitting: Gastroenterology

## 2021-07-31 DIAGNOSIS — R1030 Lower abdominal pain, unspecified: Secondary | ICD-10-CM | POA: Diagnosis not present

## 2021-08-31 DIAGNOSIS — G245 Blepharospasm: Secondary | ICD-10-CM | POA: Diagnosis not present

## 2021-08-31 DIAGNOSIS — R1011 Right upper quadrant pain: Secondary | ICD-10-CM | POA: Diagnosis not present

## 2021-08-31 DIAGNOSIS — R300 Dysuria: Secondary | ICD-10-CM | POA: Diagnosis not present

## 2021-08-31 DIAGNOSIS — R1030 Lower abdominal pain, unspecified: Secondary | ICD-10-CM | POA: Diagnosis not present

## 2021-09-18 NOTE — Progress Notes (Unsigned)
Referring Provider: [facility] Primary Care Physician:  [facility] Primary GI Physician: Dr. Gala Romney  No chief complaint on file.   HPI:   Connie [name] is a 31 y.o. female  with history of IBS (pp abdominal bloating/distension/lower abdominal pain/diarrhea) and large right lobe liver lesions consistent with [name] on MRI referred to [facility] for further evaluation/?surgery in 2018. Per Dr. Ainsley Spinner note on 03/28/2017, [place] was unchanged over last 5 months and as [name] is not hormonally responsive, they advised her that it is okay to continue birth control and safe for pregnancy.   Also with history of abnormal CT in May 2021 suggestive of nonspecific colitis/possible diverticulitis s/p colonoscopy in October 2021 with diverticulosis in the sigmoid and descending colon, otherwise normal exam.  Suspected patient likely suffered a bout of diverticulitis earlier in the year.     IBS previously improved with daily fiber and align  Last seen in our office 06/16/2021 for intermittent RUQ abdominal pain, sometimes only when overeating, sometimes positional component.  Noted right mid/right lower quadrant abdominal pain various times at least 4 episodes in the past couple of years and PCP had given her Cipro and Flagyl to have at home and take "as needed".  No alarm symptoms.  Recent abdominal ultrasound reportedly negative.  Noted isolated AST elevation going back over a decade.  Patient stated she felt the healthcare system has let her down.  She has sought out a [place] herbalist here in town.  She takes multiple vitamin supplements as well is a [place] remedy called Shijit to enhance absorption of nutrients from her GI tract.  Also takes a probiotic.  On exam, she had some RUQ tenderness to deep palpation just under the right mid axillary line, vague RLQ tenderness.  Etiology of her symptoms were unclear.  Dr. Gala Romney stated right-sided abdominal pain has almost a MSK component  though there was a visceral component to RLQ pain.   Unclear if [place] compressing her gallbladder may be playing a role.  Also noted quite a bit of anxiety. Recommended CT A/P, optimization of fiber and taking this on a daily basis, keep follow-up with OB/GYN, follow-up in 3 months.  CT A/P with contrast completed 06/23/2021 with no acute abnormalities, interval decrease size of exophytic enhancing mass at the inferior right hepatic lobe consistent with FNH.  Also noted moderate amount of retained fecal material throughout the colon.   Today:   RUQ pain:    Consider EGD/referral back to [facility]  Past Medical History:  Diagnosis Date   Diverticulitis 06/2019   Focal nodular hyperplasia of liver 2017   Stable imaging x4 years.    Hypoglycemic reaction    IBS (irritable bowel syndrome)     Past Surgical History:  Procedure Laterality Date   COLONOSCOPY N/A 12/02/2019   Procedure: COLONOSCOPY;  Surgeon: Daneil Dolin, MD; diverticulosis in the sigmoid and descending colon, otherwise normal exam.  Repeat colonoscopy at age 34.   WISDOM TOOTH EXTRACTION      Current Outpatient Medications  Medication Sig Dispense Refill   acetaminophen (TYLENOL) 500 MG tablet Take 500 mg by mouth daily as needed for moderate pain or headache.      albuterol (VENTOLIN HFA) 108 (90 Base) MCG/ACT inhaler Inhale 1-2 puffs into the lungs every 6 (six) hours as needed.     cetirizine (ZYRTEC) 10 MG tablet Take 10 mg by mouth daily as needed for allergies.      cholecalciferol (VITAMIN  D) 25 MCG (1000 UNIT) tablet Take 1,000 Units by mouth daily.     MAGNESIUM CITRATE PO Take 330 mg by mouth. Two gummies daily     Multiple Vitamins-Minerals (MULTIVITAMIN WITH MINERALS) tablet Take 1 tablet by mouth daily. Woman     Wheat Dextrin (BENEFIBER) POWD Take by mouth daily. 2 Tbsp daily     No current facility-administered medications for this visit.    Allergies as of 09/20/2021   (No Known Allergies)    Family  History  Problem Relation Age of Onset   Hypertension Mother    Heart attack Other    Colon cancer Neg Hx    Inflammatory bowel disease Neg Hx     Social History   Socioeconomic History   Marital status: Married    Spouse name: Not on file   Number of children: Not on file   Years of education: Not on file   Highest education level: Not on file  Occupational History   Not on file  Tobacco Use   Smoking status: Never   Smokeless tobacco: Never  Vaping Use   Vaping Use: Never used  Substance and Sexual Activity   Alcohol use: No   Drug use: No   Sexual activity: Yes    Birth control/protection: None  Other Topics Concern   Not on file  Social History Narrative   Not on file   Social Determinants of Health   Financial Resource Strain: Not on file  Food Insecurity: Not on file  Transportation Needs: Not on file  Physical Activity: Not on file  Stress: Not on file  Social Connections: Not on file    Review of Systems: Gen: Denies fever, chills, cold or flu like symptoms, pre-syncope, or syncope.  CV: Denies chest pain, palpitations. Resp: Denies dyspnea at rest, cough. GI: See HPI Heme: See HPI  Physical Exam: There were no vitals taken for this visit. General:   Alert and oriented. No distress noted. Pleasant and cooperative.  Head:  Normocephalic and atraumatic. Eyes:  Conjuctiva clear without scleral icterus. Heart:  S1, S2 present without murmurs appreciated. Lungs:  Clear to auscultation bilaterally. No wheezes, rales, or rhonchi. No distress.  Abdomen:  +BS, soft, non-tender and non-distended. No rebound or guarding. No HSM or masses noted. Msk:  Symmetrical without gross deformities. Normal posture. Extremities:  Without edema. Neurologic:  Alert and  oriented x4 Psych:  Normal mood and affect.    Assessment:     Plan:  ***   Aliene Altes, PA-C [facility] 09/20/2021

## 2021-09-20 ENCOUNTER — Encounter: Payer: Self-pay | Admitting: Gastroenterology

## 2021-09-20 ENCOUNTER — Ambulatory Visit (INDEPENDENT_AMBULATORY_CARE_PROVIDER_SITE_OTHER): Payer: BC Managed Care – PPO | Admitting: Gastroenterology

## 2021-09-20 VITALS — BP 114/69 | HR 86 | Temp 97.7°F | Ht 62.0 in | Wt 113.4 lb

## 2021-09-20 DIAGNOSIS — R1032 Left lower quadrant pain: Secondary | ICD-10-CM

## 2021-09-20 DIAGNOSIS — K5900 Constipation, unspecified: Secondary | ICD-10-CM | POA: Insufficient documentation

## 2021-09-20 NOTE — Patient Instructions (Addendum)
Increase Psyllium to 1 tablespoon in the morning and 1 tablespoon in the evening in efforts to allow good productive bowel movements daily.   If needed, you can add docusate sodium (colace) 100 mg daily.   For the occasional breakthrough constipation, you can try MiraLAX 17 g in 8 oz of water as this works a little stronger for you.   If you have recurrent, persistent, significant lower abdominal pain that is unrelieved by bowel movements, please let me know and we can consider a CT scan to rule out recurrent diverticulitis.   It was great to see you again today!   Aliene Altes, PA-C [facility]

## 2021-11-13 DIAGNOSIS — Z1329 Encounter for screening for other suspected endocrine disorder: Secondary | ICD-10-CM | POA: Diagnosis not present

## 2021-11-13 DIAGNOSIS — F419 Anxiety disorder, unspecified: Secondary | ICD-10-CM | POA: Diagnosis not present

## 2021-11-13 DIAGNOSIS — E559 Vitamin D deficiency, unspecified: Secondary | ICD-10-CM | POA: Diagnosis not present

## 2021-11-13 DIAGNOSIS — E538 Deficiency of other specified B group vitamins: Secondary | ICD-10-CM | POA: Diagnosis not present

## 2021-11-13 DIAGNOSIS — Z131 Encounter for screening for diabetes mellitus: Secondary | ICD-10-CM | POA: Diagnosis not present

## 2021-11-13 DIAGNOSIS — K589 Irritable bowel syndrome without diarrhea: Secondary | ICD-10-CM | POA: Diagnosis not present

## 2021-11-13 DIAGNOSIS — R5383 Other fatigue: Secondary | ICD-10-CM | POA: Diagnosis not present

## 2021-11-27 ENCOUNTER — Telehealth: Payer: BC Managed Care – PPO | Admitting: Physician Assistant

## 2021-11-27 DIAGNOSIS — H8110 Benign paroxysmal vertigo, unspecified ear: Secondary | ICD-10-CM | POA: Diagnosis not present

## 2021-11-27 NOTE — Progress Notes (Signed)
Virtual Visit Consent   Connie [name], you are scheduled for a virtual visit with a [place] provider today. Just as with appointments in the office, your consent must be obtained to participate. Your consent will be active for this visit and any virtual visit you may have with one of our providers in the next 365 days. If you have a MyChart account, a copy of this consent can be sent to you electronically.  As this is a virtual visit, video technology does not allow for your provider to perform a traditional examination. This may limit your provider's ability to fully assess your condition. If your provider identifies any concerns that need to be evaluated in person or the need to arrange testing (such as labs, EKG, etc.), we will make arrangements to do so. Although advances in technology are sophisticated, we cannot ensure that it will always work on either your end or our end. If the connection with a video visit is poor, the visit may have to be switched to a telephone visit. With either a video or telephone visit, we are not always able to ensure that we have a secure connection.  By engaging in this virtual visit, you consent to the provision of healthcare and authorize for your insurance to be billed (if applicable) for the services provided during this visit. Depending on your insurance coverage, you may receive a charge related to this service.  I need to obtain your verbal consent now. Are you willing to proceed with your visit today? Raizel Makara has provided verbal consent on 11/27/2021 for a virtual visit (video or telephone). Mar Daring, PA-C  Date: 11/27/2021 1:45 PM  Virtual Visit via Video Note   I, Mar Daring, connected with  Connie [name]  ([id number], 1990/08/26) on 11/27/21 at  1:15 PM EDT by a video-enabled telemedicine application and verified that I am speaking with the correct person using two identifiers.  Location: Patient: Virtual Visit Location  Patient: Home Provider: Virtual Visit Location Provider: Home Office   I discussed the limitations of evaluation and management by telemedicine and the availability of in person appointments. The patient expressed understanding and agreed to proceed.    History of Present Illness: Connie [name] is a 31 y.o. who identifies as a female who was assigned female at birth, and is being seen today for vertigo.  HPI: Dizziness This is a new problem. The current episode started 1 to 4 weeks ago (Started 11/15/21). The problem occurs intermittently. The problem has been gradually worsening. Associated symptoms include vertigo. Pertinent negatives include no chills, congestion, fatigue, fever, myalgias, numbness, sore throat, visual change or weakness. Exacerbated by: movements. Treatments tried: bonine, accupuncture. The treatment provided no relief.      Problems:  Patient Active Problem List   Diagnosis Date Noted   Constipation 09/20/2021   Abnormal CT scan, colon 08/20/2019   IBS (irritable bowel syndrome) 08/20/2019   Dysuria 07/10/2019   Pregnancy 05/03/2018   Liver mass 11/25/2015   Abdominal pain, epigastric 11/25/2015   LLQ abdominal pain 11/25/2015   Body mass index between 19-24, adult 06/24/2013    Allergies: No Known Allergies Medications:  Current Outpatient Medications:    acetaminophen (TYLENOL) 500 MG tablet, Take 500 mg by mouth daily as needed for moderate pain or headache. , Disp: , Rfl:    albuterol (VENTOLIN HFA) 108 (90 Base) MCG/ACT inhaler, Inhale 1-2 puffs into the lungs every 6 (six) hours as needed., Disp: , Rfl:  cetirizine (ZYRTEC) 10 MG tablet, Take 10 mg by mouth daily as needed for allergies. , Disp: , Rfl:    cholecalciferol (VITAMIN D) 25 MCG (1000 UNIT) tablet, Take 1,000 Units by mouth daily., Disp: , Rfl:    MAGNESIUM CITRATE PO, Take 330 mg by mouth. Two gummies daily, Disp: , Rfl:    Multiple Vitamins-Minerals (MULTIVITAMIN WITH MINERALS) tablet,  Take 1 tablet by mouth daily. Woman, Disp: , Rfl:    psyllium (METAMUCIL) 58.6 % powder, Take 1 packet by mouth 3 (three) times daily., Disp: , Rfl:    zinc sulfate 220 (50 Zn) MG capsule, Take 220 mg by mouth daily., Disp: , Rfl:   Observations/Objective: Patient is well-developed, well-nourished in no acute distress.  Resting comfortably at home.  Head is normocephalic, atraumatic.  No labored breathing.  Speech is clear and coherent with logical content.  Patient is alert and oriented at baseline.    Assessment and Plan: 1. Benign paroxysmal positional vertigo, unspecified laterality  - Discussed continuing her Bonine but increasing from once daily to up to 4 times daily if needed; can also take 1/2 tablet to reduce drowsiness - Discussed adding pseudoephedrine (Sudafed) '10mg'$  daily  - Discussed [place] Maneuver and [employer] exercises (provided in AVS) - Discussed trying prednisone taper, but will hold off and see if worsens - Keep scheduled appt with PCP on 12/04/21  Follow Up Instructions: I discussed the assessment and treatment plan with the patient. The patient was provided an opportunity to ask questions and all were answered. The patient agreed with the plan and demonstrated an understanding of the instructions.  A copy of instructions were sent to the patient via MyChart unless otherwise noted below.    The patient was advised to call back or seek an in-person evaluation if the symptoms worsen or if the condition fails to improve as anticipated.  Time:  I spent 12 minutes with the patient via telehealth technology discussing the above problems/concerns.    Mar Daring, PA-C

## 2021-11-27 NOTE — Patient Instructions (Signed)
Connie [name], thank you for joining Mar Daring, PA-C for today's virtual visit.  While this provider is not your primary care provider (PCP), if your PCP is located in our provider database this encounter information will be shared with them immediately following your visit.  Consent: (Patient) Connie [name] provided verbal consent for this virtual visit at the beginning of the encounter.  Current Medications:  Current Outpatient Medications:    acetaminophen (TYLENOL) 500 MG tablet, Take 500 mg by mouth daily as needed for moderate pain or headache. , Disp: , Rfl:    albuterol (VENTOLIN HFA) 108 (90 Base) MCG/ACT inhaler, Inhale 1-2 puffs into the lungs every 6 (six) hours as needed., Disp: , Rfl:    cetirizine (ZYRTEC) 10 MG tablet, Take 10 mg by mouth daily as needed for allergies. , Disp: , Rfl:    cholecalciferol (VITAMIN D) 25 MCG (1000 UNIT) tablet, Take 1,000 Units by mouth daily., Disp: , Rfl:    MAGNESIUM CITRATE PO, Take 330 mg by mouth. Two gummies daily, Disp: , Rfl:    Multiple Vitamins-Minerals (MULTIVITAMIN WITH MINERALS) tablet, Take 1 tablet by mouth daily. Woman, Disp: , Rfl:    psyllium (METAMUCIL) 58.6 % powder, Take 1 packet by mouth 3 (three) times daily., Disp: , Rfl:    zinc sulfate 220 (50 Zn) MG capsule, Take 220 mg by mouth daily., Disp: , Rfl:    Medications ordered in this encounter:  No orders of the defined types were placed in this encounter.    *If you need refills on other medications prior to your next appointment, please contact your pharmacy*  Follow-Up: Call back or seek an in-person evaluation if the symptoms worsen or if the condition fails to improve as anticipated.  [place] [phone]  Other Instructions  Benign Positional Vertigo Vertigo is the feeling that you or your surroundings are moving when they are not. Benign positional vertigo is the most common form of vertigo. This is usually a harmless condition  (benign). This condition is positional. This means that symptoms are triggered by certain movements and positions. This condition can be dangerous if it occurs while you are doing something that could cause harm to yourself or others. This includes activities such as driving or operating machinery. What are the causes? The inner ear has fluid-filled canals that help your brain sense movement and balance. When the fluid moves, the brain receives messages about your body's position. With benign positional vertigo, calcium crystals in the inner ear break free and disturb the inner ear area. This causes your brain to receive confusing messages about your body's position. What increases the risk? You are more likely to develop this condition if: You are a woman. You are 108 years of age or older. You have recently had a head injury. You have an inner ear disease. What are the signs or symptoms? Symptoms of this condition usually happen when you move your head or your eyes in different directions. Symptoms may start suddenly and usually last for less than a minute. They include: Loss of balance and falling. Feeling like you are spinning or moving. Feeling like your surroundings are spinning or moving. Nausea and vomiting. Blurred vision. Dizziness. Involuntary eye movement (nystagmus). Symptoms can be mild and cause only minor problems, or they can be severe and interfere with daily life. Episodes of benign positional vertigo may return (recur) over time. Symptoms may also improve over time. How is this diagnosed? This condition may be  diagnosed based on: Your medical history. A physical exam of the head, neck, and ears. Positional tests to check for or stimulate vertigo. You may be asked to turn your head and change positions, such as going from sitting to lying down. A health care provider will watch for symptoms of vertigo. You may be referred to a health care provider who specializes in ear,  nose, and throat problems (ENT or otolaryngologist) or a provider who specializes in disorders of the nervous system (neurologist). How is this treated?  This condition may be treated in a session in which your health care provider moves your head in specific positions to help the displaced crystals in your inner ear move. Treatment for this condition may take several sessions. Surgery may be needed in severe cases, but this is rare. In some cases, benign positional vertigo may resolve on its own in 2-4 weeks. Follow these instructions at home: Safety Move slowly. Avoid sudden body or head movements or certain positions, as told by your health care provider. Avoid driving or operating machinery until your health care provider says it is safe. Avoid doing any tasks that would be dangerous to you or others if vertigo occurs. If you have trouble walking or keeping your balance, try using a cane for stability. If you feel dizzy or unstable, sit down right away. Return to your normal activities as told by your health care provider. Ask your health care provider what activities are safe for you. General instructions Take over-the-counter and prescription medicines only as told by your health care provider. Drink enough fluid to keep your urine pale yellow. Keep all follow-up visits. This is important. Contact a health care provider if: You have a fever. Your condition gets worse or you develop new symptoms. Your family or friends notice any behavioral changes. You have nausea or vomiting that gets worse. You have numbness or a prickling and tingling sensation. Get help right away if you: Have difficulty speaking or moving. Are always dizzy or faint. Develop severe headaches. Have weakness in your legs or arms. Have changes in your hearing or vision. Develop a stiff neck. Develop sensitivity to light. These symptoms may represent a serious problem that is an emergency. Do not wait to see if  the symptoms will go away. Get medical help right away. Call your local emergency services (911 in the [place] Do not drive yourself to the hospital. Summary Vertigo is the feeling that you or your surroundings are moving when they are not. Benign positional vertigo is the most common form of vertigo. This condition is caused by calcium crystals in the inner ear that become displaced. This causes a disturbance in an area of the inner ear that helps your brain sense movement and balance. Symptoms include loss of balance and falling, feeling that you or your surroundings are moving, nausea and vomiting, and blurred vision. This condition can be diagnosed based on symptoms, a physical exam, and positional tests. Follow safety instructions as told by your health care provider and keep all follow-up visits. This is important. This information is not intended to replace advice given to you by your health care provider. Make sure you discuss any questions you have with your health care provider. Document Revised: 01/13/2020 Document Reviewed: 01/13/2020 Elsevier Patient Education  [place] Exercise for Vertigo  The Brandt-Daroff exercise is one of several exercises that can speed up the compensation process and end the symptoms of vertigo. It often is prescribed  for people who have benign paroxysmal positional vertigo (BPPV) and sometimes for labyrinthitis. These exercises won't cure these conditions. But over time they can reduce symptoms of vertigo.  People who use this exercise usually are told to do several repetitions of the exercise at least twice a day.  How It Is Done To do the Brandt-Daroff exercise:  Start in an upright, seated position. Move into the lying position on one side with your nose pointed up at about a 45-degree angle. Remain in this position for about 30 seconds (or until the vertigo subsides, whichever is longer), then move back to the seated  position. Repeat steps 2 and 3 on the other side. What To Expect Symptoms sometimes suddenly go away during an exercise period. More often, improvement occurs gradually over a period of weeks or months.  Why It Is Done The Brandt-Daroff exercise and other similar exercises are used to treat BPPV. These exercises are sometimes used to treat labyrinthitis or vestibular neuritis.  How Well It Works These exercises can help your body get used to the confusing signals that are causing your vertigo. This may help you get over your vertigo sooner.  The Brandt-Daroff exercise does not help relieve the symptoms of benign paroxysmal positional vertigo (BPPV) as well as the [place] maneuver or the Epley maneuver.footnote1  Risks There are no risks in doing these exercises. To avoid hitting your head or developing minor neck injuries, be careful not to lie down too quickly.  References Citations Fife TD, et al. (2008). Practice parameter: Therapies for benign paroxysmal positional vertigo (an evidence-based review). Report of the Quality Standards Subcommittee of the [place] [employer]. Neurology, 70(22): [phone]. Current as of: Jun 29, 2020   How to Perform the Epley Maneuver The Epley maneuver is an exercise that relieves symptoms of vertigo. Vertigo is the feeling that you or your surroundings are moving when they are not. When you feel vertigo, you may feel like the room is spinning and may have trouble walking. The Epley maneuver is used for a type of vertigo caused by a calcium deposit in a part of the inner ear. The maneuver involves changing head positions to help the deposit move out of the area. You can do this maneuver at home whenever you have symptoms of vertigo. You can repeat it in 24 hours if your vertigo has not gone away. Even though the Epley maneuver may relieve your vertigo for a few weeks, it is possible that your symptoms will return. This maneuver relieves vertigo, but  it does not relieve dizziness. What are the risks? If it is done correctly, the Epley maneuver is considered safe. Sometimes it can lead to dizziness or nausea that goes away after a short time. If you develop other symptoms--such as changes in vision, weakness, or numbness--stop doing the maneuver and call your health care provider. Supplies needed: A bed or table. A pillow. How to do the Epley maneuver     Sit on the edge of a bed or table with your back straight and your legs extended or hanging over the edge of the bed or table. Turn your head halfway toward the affected ear or side as told by your health care provider. Lie backward quickly with your head turned until you are lying flat on your back. Your head should dangle (head-hanging position). You may want to position a pillow under your shoulders. Hold this position for at least 30 seconds. If you feel dizzy or have symptoms  of vertigo, continue to hold the position until the symptoms stop. Turn your head to the opposite direction until your unaffected ear is facing down. Your head should continue to dangle. Hold this position for at least 30 seconds. If you feel dizzy or have symptoms of vertigo, continue to hold the position until the symptoms stop. Turn your whole body to the same side as your head so that you are positioned on your side. Your head will now be nearly facedown and no longer needs to dangle. Hold for at least 30 seconds. If you feel dizzy or have symptoms of vertigo, continue to hold the position until the symptoms stop. Sit back up. You can repeat the maneuver in 24 hours if your vertigo does not go away. Follow these instructions at home: For 24 hours after doing the Epley maneuver: Keep your head in an upright position. When lying down to sleep or rest, keep your head raised (elevated) with two or more pillows. Avoid excessive neck movements. Activity Do not drive or use machinery if you feel dizzy. After  doing the Epley maneuver, return to your normal activities as told by your health care provider. Ask your health care provider what activities are safe for you. General instructions Drink enough fluid to keep your urine pale yellow. Do not drink alcohol. Take over-the-counter and prescription medicines only as told by your health care provider. Keep all follow-up visits. This is important. Preventing vertigo symptoms Ask your health care provider if there is anything you should do at home to prevent vertigo. He or she may recommend that you: Keep your head elevated with two or more pillows while you sleep. Do not sleep on the side of your affected ear. Get up slowly from bed. Avoid sudden movements during the day. Avoid extreme head positions or movement, such as looking up or bending over. Contact a health care provider if: Your vertigo gets worse. You have other symptoms, including: Nausea. Vomiting. Headache. Get help right away if you: Have vision changes. Have a headache or neck pain that is severe or getting worse. Cannot stop vomiting. Have new numbness or weakness in any part of your body. These symptoms may represent a serious problem that is an emergency. Do not wait to see if the symptoms will go away. Get medical help right away. Call your local emergency services (911 in the [place] Do not drive yourself to the hospital. Summary Vertigo is the feeling that you or your surroundings are moving when they are not. The Epley maneuver is an exercise that relieves symptoms of vertigo. If the Epley maneuver is done correctly, it is considered safe. This information is not intended to replace advice given to you by your health care provider. Make sure you discuss any questions you have with your health care provider. Document Revised: 01/13/2020 Document Reviewed: 01/13/2020 Elsevier Patient Education  [place].    If you have been instructed to have an in-person  evaluation today at a local Urgent Care facility, please use the link below. It will take you to a list of all of our available [facility], including address, phone number and hours of operation. Please do not delay care.  [facility]  If you or a family member do not have a primary care provider, use the link below to schedule a visit and establish care. When you choose a [facility] primary care physician or advanced practice provider, you gain a long-term partner in  health. Find a Primary Care Provider  Learn more about [facility]'s in-office and virtual care options: [facility]

## 2021-12-04 DIAGNOSIS — Z1331 Encounter for screening for depression: Secondary | ICD-10-CM | POA: Diagnosis not present

## 2021-12-04 DIAGNOSIS — M353 Polymyalgia rheumatica: Secondary | ICD-10-CM | POA: Diagnosis not present

## 2021-12-04 DIAGNOSIS — G245 Blepharospasm: Secondary | ICD-10-CM | POA: Diagnosis not present

## 2021-12-04 DIAGNOSIS — K5730 Diverticulosis of large intestine without perforation or abscess without bleeding: Secondary | ICD-10-CM | POA: Diagnosis not present

## 2021-12-04 DIAGNOSIS — Z6820 Body mass index (BMI) 20.0-20.9, adult: Secondary | ICD-10-CM | POA: Diagnosis not present

## 2021-12-04 DIAGNOSIS — Z0000 Encounter for general adult medical examination without abnormal findings: Secondary | ICD-10-CM | POA: Diagnosis not present

## 2021-12-04 DIAGNOSIS — E782 Mixed hyperlipidemia: Secondary | ICD-10-CM | POA: Diagnosis not present

## 2021-12-04 DIAGNOSIS — Z0182 Encounter for allergy testing: Secondary | ICD-10-CM | POA: Diagnosis not present

## 2021-12-04 DIAGNOSIS — K219 Gastro-esophageal reflux disease without esophagitis: Secondary | ICD-10-CM | POA: Diagnosis not present

## 2021-12-04 DIAGNOSIS — K588 Other irritable bowel syndrome: Secondary | ICD-10-CM | POA: Diagnosis not present

## 2021-12-04 DIAGNOSIS — E559 Vitamin D deficiency, unspecified: Secondary | ICD-10-CM | POA: Diagnosis not present

## 2021-12-04 DIAGNOSIS — K5732 Diverticulitis of large intestine without perforation or abscess without bleeding: Secondary | ICD-10-CM | POA: Diagnosis not present

## 2021-12-18 NOTE — H&P (View-Only) (Signed)
Referring Provider: [facility] Primary Care Physician:  [facility] Primary GI Physician: Dr. Gala Romney  Chief Complaint  Patient presents with   Follow-up    Having trouble swallowing.     HPI:   Connie [name] is a 31 y.o. female with history of IBS, large right lobe liver lesion consistent with [place] with interval decrease in size on recent CT in [place] 2023, likely episode of diverticulitis in May 2021 with follow-up colonoscopy in October 2021 with diverticulosis in sigmoid and descending colon, otherwise normal exam, presenting today for follow-up.  IBS previously improved with daily fiber and align.  Last seen in our office 09/20/2021.  Reported chronic intermittent lower abdominal pain left lower quadrant or right lower quadrant.  Reported feeling a bulge in her lower abdomen on the right side after her prior visit in [place] and was referred to a general surgeon for hernia, but she was found not to have a hernia on imaging or exam.  Overall, it was suspected that she pulled a muscle in her lower abdomen and was counseled on supportive measures.  She did this and pain improved.  2 weeks prior to her office visit in July, she developed constant LLQ abdominal pain which first noted when she was becoming constipated.  She increased psyllium, but no improvement.  Rested and applied heat but again no improvement.  Started clear liquid diet due to concern for diverticulitis and after couple of days, symptoms significantly improved.  By the time of her office visit, her abdominal pain had improved and she just had some residual gassy/bloating sensation.  Bowels moving fairly well with psyllium 1 tablespoon daily.  No alarm symptoms.  MiraLAX previously too strong.  Query whether patient experienced mild flare of diverticulitis versus flare of IBS symptoms.  Recommended continuing current medications, increase psyllium to twice daily, may add colace or MiraLAX if needed,  monitor for recurrent persistent abdominal pain.   Today:  Reports has had a couple of episodes of mild lower abdominal pain since I saw her last, but nothing persistent.  Seems to improve when she drinks bone broth.  Bowels are moving well with psyllium 1 tablespoon daily.  Using MiraLAX if needed.  For the last 3 months, she has been having increasing problems with swallowing.  She is always had problems with swallowing pills, but is now having trouble swallowing foods.  She feels that foods are getting hung in her throat.  She will have to cough them back up at times and then drink some water to get them to go down.  This is occurred with oatmeal, chips, but is worse with meats.  She also reports increased stress recently.  Her father was diagnosed with stage IV esophageal cancer.  She has a lot of anxiety related to this.  Has intermittent tightening sensation in her neck and jaw.  She also reports new onset of heartburn over the last few weeks.  Symptoms occur daily.  She is also noticed in her mid epigastric burning without identified trigger.  Not specifically postprandially.  No nausea or vomiting.  No BRBPR or melena.  No NSAIDs.   Past Medical History:  Diagnosis Date   Diverticulitis 06/2019   Focal nodular hyperplasia of liver 2017   Stable imaging x4 years.    Hypoglycemic reaction    IBS (irritable bowel syndrome)     Past Surgical History:  Procedure Laterality Date   COLONOSCOPY N/A 12/02/2019   Procedure: COLONOSCOPY;  Surgeon: Daneil Dolin, MD; diverticulosis in the sigmoid and descending colon, otherwise normal exam.  Repeat colonoscopy at age 67.   WISDOM TOOTH EXTRACTION      Current Outpatient Medications  Medication Sig Dispense Refill   acetaminophen (TYLENOL) 500 MG tablet Take 500 mg by mouth daily as needed for moderate pain or headache.      albuterol (VENTOLIN HFA) 108 (90 Base) MCG/ACT inhaler Inhale 1-2 puffs into the lungs every 6 (six) hours as needed.      cholecalciferol (VITAMIN D) 25 MCG (1000 UNIT) tablet Take 1,000 Units by mouth daily.     Digestive Enzyme CAPS Take by mouth.     MAGNESIUM CITRATE PO Take 330 mg by mouth. Two gummies daily     Multiple Vitamins-Minerals (MULTIVITAMIN WITH MINERALS) tablet Take 1 tablet by mouth daily. Woman     omeprazole (PRILOSEC) 20 MG capsule Take 1 capsule (20 mg total) by mouth daily before breakfast. Open capsule and place contents into applesauce. 30 capsule 3   OVER THE COUNTER MEDICATION Lemon Balm once a day for stress     psyllium (METAMUCIL) 58.6 % powder Take 1 packet by mouth 3 (three) times daily.     zinc sulfate 220 (50 Zn) MG capsule Take 220 mg by mouth daily.     cetirizine (ZYRTEC) 10 MG tablet Take 10 mg by mouth daily as needed for allergies.  (Patient not taking: Reported on 12/20/2021)     No current facility-administered medications for this visit.    Allergies as of 12/20/2021   (No Known Allergies)    Family History  Problem Relation Age of Onset   Hypertension Mother    Esophageal cancer Father    Heart attack Other    Colon cancer Neg Hx    Inflammatory bowel disease Neg Hx     Social History   Socioeconomic History   Marital status: Married    Spouse name: Not on file   Number of children: Not on file   Years of education: Not on file   Highest education level: Not on file  Occupational History   Not on file  Tobacco Use   Smoking status: Never   Smokeless tobacco: Never  Vaping Use   Vaping Use: Never used  Substance and Sexual Activity   Alcohol use: No   Drug use: No   Sexual activity: Yes    Birth control/protection: None  Other Topics Concern   Not on file  Social History Narrative   Not on file   Social Determinants of Health   Financial Resource Strain: Not on file  Food Insecurity: Not on file  Transportation Needs: Not on file  Physical Activity: Not on file  Stress: Not on file  Social Connections: Not on file    Review of  Systems: Gen: Denies fever, chills, cold or flu like symptoms, pre-syncope, or syncope.  CV: Denies chest pain, palpitations. Resp: Denies dyspnea, cough.  GI: See HPI Heme: See HPI  Physical Exam: BP 126/71 (BP Location: Right Arm, Patient Position: Sitting, Cuff Size: Normal)   Pulse 92   Temp 97.6 F (36.4 C) (Temporal)   Ht '5\' 2"'$  (1.575 m)   Wt 112 lb 3.2 oz (50.9 kg)   LMP 11/30/2021 (Approximate)   BMI 20.52 kg/m  General:   Alert and oriented. No distress noted. Pleasant and cooperative.  Head:  Normocephalic and atraumatic. Eyes:  Conjuctiva clear without scleral icterus. Heart:  S1, S2 present without murmurs  appreciated. Lungs:  Clear to auscultation bilaterally. No wheezes, rales, or rhonchi. No distress.  Abdomen:  +BS, soft, non-tender and non-distended. No rebound or guarding. No HSM or masses noted. Msk:  Symmetrical without gross deformities. Normal posture. Extremities:  Without edema. Neurologic:  Alert and  oriented x4 Psych:  Normal mood and affect.    Assessment:  31 y.o. 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  Dysphagia: Chronic history of pill dysphagia, but new onset food dysphagia for the last 3 months with sensation of items getting hung in her throat.  She does have to cough foods back up at times.  Admits to new onset heartburn over the last few weeks as well.  Father recently diagnosed with stage IV esophageal cancer.  Some of her symptoms may be stress-induced as she also reports intermittent tightening sensation in her throat and jaw and increased worry regarding concerns for cancer considering her father's recent diagnosis.  However, unable to rule out true pathology such as esophagitis,  esophageal web, ring, stricture.  Doubt malignancy.  We discussed barium pill esophagram, but ultimately it was decided to go ahead and proceed with EGD for further evaluation and therapeutic intervention.  Heartburn/epigastric burning: New onset daily heartburn and intermittent epigastric burning over the last 3 weeks.  She does not really consume any common reflux triggers.  She has started digestive enzymes recently and queried whether this may be contributing, but symptoms continued after holding digestive enzymes.  No brbpr, melena, or NSAIDs. It is possible that increased stress related to father's recent diagnosis of esophageal cancer is contributing to her symptoms.  For now, we will go ahead and start her on omeprazole 20 mg daily.  We are also proceeding with an EGD to evaluate dysphagia as discussed above.  This will help rule out esophagitis, gastritis, duodenitis, H. pylori contributing to epigastric pain as well.    Plan:  Proceed with upper endoscopy +/- dilation with propofol by Dr. Gala Romney in near future. The risks, benefits, and alternatives have been discussed with the patient in detail. The patient states understanding and desires to proceed.  ASA 2 Urine pregnancy test prior to EGD.  Start omeprazole 20 mg daily 30 minutes before breakfast.  Advised that she can open the capsule and place contents into applesauce due to dysphagia concerns. Swallowing precautions discussed.  Separate instructions provided on AVS. GERD diet/lifestyle also reinforced.  Separate written instructions provided on AVS. Follow-up in 3 months or sooner if needed.   Aliene Altes, PA-C [facility] 12/20/2021

## 2021-12-18 NOTE — Progress Notes (Unsigned)
Referring Provider: [facility] Primary Care Physician:  [facility] Primary GI Physician: Dr. Gala Romney  No chief complaint on file.   HPI:   Connie [name] is a 31 y.o. female with history of IBS, large right lobe liver lesion consistent with [place] with interval decrease in size on recent CT in [place] 2023, likely episode of diverticulitis in May 2021 with follow-up colonoscopy in October 2021 with diverticulosis in sigmoid and descending colon, otherwise normal exam, presenting today for follow-up.  IBS previously improved with daily fiber and align.  Last seen in our office 09/20/2021.  Reported chronic intermittent lower abdominal pain left lower quadrant or right lower quadrant.  Reported feeling a bulge in her lower abdomen on the right side after her prior visit in [place] and was referred to a general surgeon for hernia, but she was found not to have a hernia on imaging or exam.  Overall, it was suspected that she pulled a muscle in her lower abdomen and was counseled on supportive measures.  She did this and pain improved.  2 weeks prior to her office visit in July, she developed constant LLQ abdominal pain which first noted when she was becoming constipated.  She increased psyllium, but no improvement.  Rested and applied heat but again no improvement.  Started clear liquid diet due to concern for diverticulitis and after couple of days, symptoms significantly improved.  By the time of her office visit, her abdominal pain had improved and she just had some residual gassy/bloating sensation.  Bowels moving fairly well with psyllium 1 tablespoon daily.  No alarm symptoms.  MiraLAX previously too strong.  Query whether patient experienced mild flare of diverticulitis versus flare of IBS symptoms.  Recommended continuing current medications, increase psyllium to twice daily, may add colace or MiraLAX if needed, monitor for recurrent persistent abdominal pain.    Today:    Past Medical History:  Diagnosis Date   Diverticulitis 06/2019   Focal nodular hyperplasia of liver 2017   Stable imaging x4 years.    Hypoglycemic reaction    IBS (irritable bowel syndrome)     Past Surgical History:  Procedure Laterality Date   COLONOSCOPY N/A 12/02/2019   Procedure: COLONOSCOPY;  Surgeon: Daneil Dolin, MD; diverticulosis in the sigmoid and descending colon, otherwise normal exam.  Repeat colonoscopy at age 28.   WISDOM TOOTH EXTRACTION      Current Outpatient Medications  Medication Sig Dispense Refill   acetaminophen (TYLENOL) 500 MG tablet Take 500 mg by mouth daily as needed for moderate pain or headache.      albuterol (VENTOLIN HFA) 108 (90 Base) MCG/ACT inhaler Inhale 1-2 puffs into the lungs every 6 (six) hours as needed.     cetirizine (ZYRTEC) 10 MG tablet Take 10 mg by mouth daily as needed for allergies.      cholecalciferol (VITAMIN D) 25 MCG (1000 UNIT) tablet Take 1,000 Units by mouth daily.     MAGNESIUM CITRATE PO Take 330 mg by mouth. Two gummies daily     Multiple Vitamins-Minerals (MULTIVITAMIN WITH MINERALS) tablet Take 1 tablet by mouth daily. Woman     psyllium (METAMUCIL) 58.6 % powder Take 1 packet by mouth 3 (three) times daily.     zinc sulfate 220 (50 Zn) MG capsule Take 220 mg by mouth daily.     No current facility-administered medications for this visit.    Allergies as of 12/20/2021   (No Known Allergies)  Family History  Problem Relation Age of Onset   Hypertension Mother    Heart attack Other    Colon cancer Neg Hx    Inflammatory bowel disease Neg Hx     Social History   Socioeconomic History   Marital status: Married    Spouse name: Not on file   Number of children: Not on file   Years of education: Not on file   Highest education level: Not on file  Occupational History   Not on file  Tobacco Use   Smoking status: Never   Smokeless tobacco: Never  Vaping Use   Vaping Use: Never used   Substance and Sexual Activity   Alcohol use: No   Drug use: No   Sexual activity: Yes    Birth control/protection: None  Other Topics Concern   Not on file  Social History Narrative   Not on file   Social Determinants of Health   Financial Resource Strain: Not on file  Food Insecurity: Not on file  Transportation Needs: Not on file  Physical Activity: Not on file  Stress: Not on file  Social Connections: Not on file    Review of Systems: Gen: Denies fever, chills, cold or flu like symptoms, pre-syncope, or syncope.  CV: Denies chest pain, palpitations. Resp: Denies dyspnea, cough.  GI: See HPI Heme: See HPI  Physical Exam: There were no vitals taken for this visit. General:   Alert and oriented. No distress noted. Pleasant and cooperative.  Head:  Normocephalic and atraumatic. Eyes:  Conjuctiva clear without scleral icterus. Heart:  S1, S2 present without murmurs appreciated. Lungs:  Clear to auscultation bilaterally. No wheezes, rales, or rhonchi. No distress.  Abdomen:  +BS, soft, non-tender and non-distended. No rebound or guarding. No HSM or masses noted. Msk:  Symmetrical without gross deformities. Normal posture. Extremities:  Without edema. Neurologic:  Alert and  oriented x4 Psych:  Normal mood and affect.    Assessment:     Plan:  ***   Aliene Altes, PA-C [facility] 12/20/2021

## 2021-12-20 ENCOUNTER — Encounter: Payer: Self-pay | Admitting: *Deleted

## 2021-12-20 ENCOUNTER — Ambulatory Visit (INDEPENDENT_AMBULATORY_CARE_PROVIDER_SITE_OTHER): Payer: BC Managed Care – PPO | Admitting: Gastroenterology

## 2021-12-20 ENCOUNTER — Encounter: Payer: Self-pay | Admitting: Gastroenterology

## 2021-12-20 VITALS — BP 126/71 | HR 92 | Temp 97.6°F | Ht 62.0 in | Wt 112.2 lb

## 2021-12-20 DIAGNOSIS — K219 Gastro-esophageal reflux disease without esophagitis: Secondary | ICD-10-CM | POA: Diagnosis not present

## 2021-12-20 DIAGNOSIS — R1013 Epigastric pain: Secondary | ICD-10-CM

## 2021-12-20 DIAGNOSIS — R1310 Dysphagia, unspecified: Secondary | ICD-10-CM | POA: Diagnosis not present

## 2021-12-20 MED ORDER — OMEPRAZOLE 20 MG PO CPDR: DELAYED_RELEASE_CAPSULE | ORAL | 3 refills | Status: DC

## 2021-12-20 NOTE — Patient Instructions (Signed)
We will arrange to have an upper endoscopy with possible stretching of your esophagus in the near future with Dr. Gala Romney.  Start omeprazole 20 mg daily 30 minutes before breakfast for acid reflux.  You may open the capsule and place the contents into applesauce.  Follow a GERD diet:  Avoid fried, fatty, greasy, spicy, citrus foods. Avoid caffeine and carbonated beverages. Avoid chocolate. Try eating 4-6 small meals a day rather than 3 large meals. Do not eat within 3 hours of laying down. Prop head of bed up on wood or bricks to create a 6 inch incline.  Swallowing precautions:  Eat slowly, take small bites, chew thoroughly, drink plenty of liquids throughout meals.  Avoid trough textures All meats should be chopped finely.  If something gets hung in your esophagus and will not come up or go down, proceed to the emergency room.    We will plan to see back in 3 months.  Do not hesitate to call sooner if you have questions or concerns.  Aliene Altes, PA-C [facility]

## 2021-12-21 ENCOUNTER — Ambulatory Visit: Payer: BC Managed Care – PPO | Admitting: Gastroenterology

## 2021-12-21 ENCOUNTER — Other Ambulatory Visit: Payer: Self-pay | Admitting: *Deleted

## 2021-12-21 DIAGNOSIS — K219 Gastro-esophageal reflux disease without esophagitis: Secondary | ICD-10-CM

## 2021-12-21 DIAGNOSIS — R1310 Dysphagia, unspecified: Secondary | ICD-10-CM

## 2021-12-22 ENCOUNTER — Other Ambulatory Visit (HOSPITAL_COMMUNITY)
Admission: RE | Admit: 2021-12-22 | Discharge: 2021-12-22 | Disposition: A | Discharge status: Home / Self Care | Payer: BC Managed Care – PPO | Source: Ambulatory Visit | Attending: Internal Medicine | Admitting: Internal Medicine

## 2021-12-22 DIAGNOSIS — K219 Gastro-esophageal reflux disease without esophagitis: Secondary | ICD-10-CM

## 2021-12-22 DIAGNOSIS — K589 Irritable bowel syndrome without diarrhea: Secondary | ICD-10-CM | POA: Diagnosis not present

## 2021-12-22 DIAGNOSIS — K449 Diaphragmatic hernia without obstruction or gangrene: Secondary | ICD-10-CM | POA: Diagnosis not present

## 2021-12-22 DIAGNOSIS — R1310 Dysphagia, unspecified: Secondary | ICD-10-CM

## 2021-12-22 LAB — PREGNANCY, URINE: Preg Test, Ur: NEGATIVE

## 2021-12-25 ENCOUNTER — Ambulatory Visit (HOSPITAL_COMMUNITY)
Admission: RE | Admit: 2021-12-25 | Discharge: 2021-12-25 | Disposition: A | Discharge status: Home / Self Care | Payer: BC Managed Care – PPO | Attending: Internal Medicine | Admitting: Internal Medicine

## 2021-12-25 ENCOUNTER — Ambulatory Visit (HOSPITAL_COMMUNITY): Payer: BC Managed Care – PPO | Admitting: Anesthesiology

## 2021-12-25 ENCOUNTER — Other Ambulatory Visit: Payer: Self-pay

## 2021-12-25 ENCOUNTER — Encounter (HOSPITAL_COMMUNITY)
Admission: RE | Disposition: A | Discharge status: Home / Self Care | Payer: Self-pay | Source: Home / Self Care | Attending: Internal Medicine

## 2021-12-25 DIAGNOSIS — R1013 Epigastric pain: Secondary | ICD-10-CM

## 2021-12-25 DIAGNOSIS — K219 Gastro-esophageal reflux disease without esophagitis: Secondary | ICD-10-CM | POA: Diagnosis not present

## 2021-12-25 DIAGNOSIS — K589 Irritable bowel syndrome without diarrhea: Secondary | ICD-10-CM | POA: Insufficient documentation

## 2021-12-25 DIAGNOSIS — R1310 Dysphagia, unspecified: Secondary | ICD-10-CM | POA: Diagnosis not present

## 2021-12-25 DIAGNOSIS — K449 Diaphragmatic hernia without obstruction or gangrene: Secondary | ICD-10-CM | POA: Insufficient documentation

## 2021-12-25 HISTORY — PX: ESOPHAGOGASTRODUODENOSCOPY (EGD) WITH PROPOFOL: SHX5813

## 2021-12-25 HISTORY — PX: BIOPSY: SHX5522

## 2021-12-25 HISTORY — PX: MALONEY DILATION: SHX5535

## 2021-12-25 SURGERY — ESOPHAGOGASTRODUODENOSCOPY (EGD) WITH PROPOFOL
Anesthesia: General

## 2021-12-25 MED ORDER — LACTATED RINGERS IV SOLN: INTRAVENOUS | Status: DC

## 2021-12-25 MED ORDER — LACTATED RINGERS IV SOLN: INTRAVENOUS | Status: DC | PRN

## 2021-12-25 MED ORDER — LIDOCAINE HCL (CARDIAC) PF 100 MG/5ML IV SOSY
PREFILLED_SYRINGE | INTRAVENOUS | Status: DC | PRN
  Administered 2021-12-25: 60 mg via INTRATRACHEAL

## 2021-12-25 MED ORDER — PROPOFOL 10 MG/ML IV BOLUS
INTRAVENOUS | Status: DC | PRN
  Administered 2021-12-25 (×2): 50 mg via INTRAVENOUS
  Administered 2021-12-25: 100 mg via INTRAVENOUS
  Administered 2021-12-25 (×2): 50 mg via INTRAVENOUS

## 2021-12-25 NOTE — Interval H&P Note (Signed)
History and Physical Interval Note:  12/25/2021 8:12 AM  Connie [name]  has presented today for surgery, with the diagnosis of GERD,dysphagia,epigastric burning.  The various methods of treatment have been discussed with the patient and family. After consideration of risks, benefits and other options for treatment, the patient has consented to  Procedure(s) with comments: ESOPHAGOGASTRODUODENOSCOPY (EGD) WITH PROPOFOL (N/A) - 8:45 am MALONEY DILATION (N/A) as a surgical intervention.  The patient's history has been reviewed, patient examined, no change in status, stable for surgery.  I have reviewed the patient's chart and labs.  Questions were answered to the patient's satisfaction.     Connie [name]  No change.The risks, benefits, limitations, alternatives and imponderables have been reviewed with the patient. Potential for esophageal dilation, biopsy, etc. have also been reviewed.  Questions have been answered. All parties agreeable.

## 2021-12-25 NOTE — Anesthesia Preprocedure Evaluation (Addendum)
Anesthesia Evaluation  Patient identified by MRN, date of birth, ID band Patient awake    Reviewed: Allergy & Precautions, NPO status , Patient's Chart, lab work & pertinent test results  Airway Mallampati: II  TM Distance: >3 FB Neck ROM: Full    Dental  (+) Dental Advisory Given, Teeth Intact   Pulmonary neg pulmonary ROS,    Pulmonary exam normal breath sounds clear to auscultation       Cardiovascular negative cardio ROS Normal cardiovascular exam Rhythm:Regular Rate:Normal     Neuro/Psych negative neurological ROS  negative psych ROS   GI/Hepatic negative GI ROS, Neg liver ROS,   Endo/Other  negative endocrine ROS  Renal/GU negative Renal ROS  negative genitourinary   Musculoskeletal negative musculoskeletal ROS (+)   Abdominal   Peds negative pediatric ROS (+)  Hematology negative hematology ROS (+)   Anesthesia Other Findings Hypoglycemic episodes   Reproductive/Obstetrics negative OB ROS                           Anesthesia Physical Anesthesia Plan  ASA: 2  Anesthesia Plan: General   Post-op Pain Management: Minimal or no pain anticipated   Induction: Intravenous  PONV Risk Score and Plan: Propofol infusion  Airway Management Planned: Nasal Cannula and Natural Airway  Additional Equipment:   Intra-op Plan:   Post-operative Plan:   Informed Consent: I have reviewed the patients History and Physical, chart, labs and discussed the procedure including the risks, benefits and alternatives for the proposed anesthesia with the patient or authorized representative who has indicated his/her understanding and acceptance.     Dental advisory given  Plan Discussed with: CRNA and Surgeon  Anesthesia Plan Comments:       Anesthesia Quick Evaluation

## 2021-12-25 NOTE — Anesthesia Postprocedure Evaluation (Signed)
Anesthesia Post Note  Patient: Connie [name]  Procedure(s) Performed: ESOPHAGOGASTRODUODENOSCOPY (EGD) WITH PROPOFOL [place]  Patient location during evaluation: Phase II Anesthesia Type: General Level of consciousness: awake and alert and oriented Pain management: pain level controlled Vital Signs Assessment: post-procedure vital signs reviewed and stable Respiratory status: spontaneous breathing, nonlabored ventilation and respiratory function stable Cardiovascular status: blood pressure returned to baseline and stable Postop Assessment: no apparent nausea or vomiting Anesthetic complications: no   No notable events documented.   Last Vitals:  Vitals:   12/25/21 0835 12/25/21 0838  BP: (!) 85/51   Pulse: 86 87  Resp: 19   Temp:    SpO2: 100% 100%    Last Pain:  Vitals:   12/25/21 0835  TempSrc:   PainSc: 0-No pain                 Shashank Kwasnik C Sallee Hogrefe

## 2021-12-25 NOTE — Discharge Instructions (Addendum)
EGD Discharge instructions Please read the instructions outlined below and refer to this sheet in the next few weeks. These discharge instructions provide you with general information on caring for yourself after you leave the hospital. Your doctor may also give you specific instructions. While your treatment has been planned according to the most current medical practices available, unavoidable complications occasionally occur. If you have any problems or questions after discharge, please call your doctor. ACTIVITY You may resume your regular activity but move at a slower pace for the next 24 hours.  Take frequent rest periods for the next 24 hours.  Walking will help expel (get rid of) the air and reduce the bloated feeling in your abdomen.  No driving for 24 hours (because of the anesthesia (medicine) used during the test).  You may shower.  Do not sign any important legal documents or operate any machinery for 24 hours (because of the anesthesia used during the test).  NUTRITION Drink plenty of fluids.  You may resume your normal diet.  Begin with a light meal and progress to your normal diet.  Avoid alcoholic beverages for 24 hours or as instructed by your caregiver.  MEDICATIONS You may resume your normal medications unless your caregiver tells you otherwise.  WHAT YOU CAN EXPECT TODAY You may experience abdominal discomfort such as a feeling of fullness or "gas" pains.  FOLLOW-UP Your doctor will discuss the results of your test with you.  SEEK IMMEDIATE MEDICAL ATTENTION IF ANY OF THE FOLLOWING OCCUR: Excessive nausea (feeling sick to your stomach) and/or vomiting.  Severe abdominal pain and distention (swelling).  Trouble swallowing.  Temperature over 101 F (37.8 C).  Rectal bleeding or vomiting of blood.      Your upper GI tract looked good except for very small hiatal hernia.  I took samples of your esophagus.  Your esophagus was stretched  Please begin omeprazole 20 mg  daily each morning before breakfast previously recommended  Further recommendations to follow pending review of pathology report  Office visit with Aliene Altes in 3 months  Office will call with appointment  Patient request, I called Shawna Orleans at [phone] -rolled to voicemail.  Left a detailed message.

## 2021-12-25 NOTE — Transfer of Care (Signed)
Immediate Anesthesia Transfer of Care Note  Patient: Connie [name]  Procedure(s) Performed: ESOPHAGOGASTRODUODENOSCOPY (EGD) WITH PROPOFOL [place]  Patient Location: Short Stay  Anesthesia Type:General  Level of Consciousness: sedated  Airway & Oxygen Therapy: Patient Spontanous Breathing  Post-op Assessment: Report given to RN and Post -op Vital signs reviewed and stable  Post vital signs: Reviewed and stable  Last Vitals:  Vitals Value Taken Time  BP 100/60   Temp 98   Pulse 88   Resp 16   SpO2 96     Last Pain:  Vitals:   12/25/21 0812  TempSrc:   PainSc: 0-No pain      Patients Stated Pain Goal: 7 (83/16/74 2552)  Complications: No notable events documented.

## 2021-12-25 NOTE — Op Note (Signed)
[facility] Patient Name: Connie [name] Procedure Date: 12/25/2021 7:57 AM MRN: [id number] Date of Birth: 1990/04/02 Attending MD: Norvel Richards , MD, [id number] CSN: [id number] Age: 31 Admit Type: Outpatient Procedure:                Upper GI endoscopy Indications:              Dysphagia Providers:                Norvel Richards, MD, Rosina Lowenstein, RN, Raphael Gibney, Technician Referring MD:              Medicines:                Propofol per Anesthesia Complications:            No immediate complications. Estimated Blood Loss:     Estimated blood loss was minimal. Procedure:                Pre-Anesthesia Assessment:                           - Prior to the procedure, a History and Physical                            was performed, and patient medications and                            allergies were reviewed. The patient's tolerance of                            previous anesthesia was also reviewed. The risks                            and benefits of the procedure and the sedation                            options and risks were discussed with the patient.                            All questions were answered, and informed consent                            was obtained. Prior Anticoagulants: The patient has                            taken no anticoagulant or antiplatelet agents. ASA                            Grade Assessment: III - A patient with severe                            systemic disease. After reviewing the risks and  benefits, the patient was deemed in satisfactory                            condition to undergo the procedure.                           After obtaining informed consent, the endoscope was                            passed under direct vision. Throughout the                            procedure, the patient's blood pressure, pulse, and                            oxygen saturations  were monitored continuously. The                            [id number]-H190 ([id number]) scope was introduced through the                            mouth, and advanced to the second part of duodenum.                            The upper GI endoscopy was accomplished without                            difficulty. The patient tolerated the procedure                            well. Scope In: 8:16:01 AM Scope Out: 8:25:43 AM Total Procedure Duration: 0 hours 9 minutes 42 seconds  Findings:      The examined esophagus was normal side from a small squamous papilloma..      A small hiatal hernia was present. Gastric cavity empty. Normal gastric       mucosa. Patent pylorus.      The duodenal bulb and second portion of the duodenum were normal. The       scope was withdrawn. Dilation was performed with a Maloney dilator with       mild resistance at 50 Fr. The dilation site was examined following       endoscope reinsertion and showed no change. Estimated blood loss: none.       Finally, biopsies of mid and distal esophagus were taken for histologic       study. Admitted blood loss minimal Impression:               - Normal esophagus (squamous papilloma). Dilated                            biopsied.                           - Small hiatal hernia.                           - Normal  duodenal bulb and second portion of the                            duodenum.                           - Moderate Sedation:      Moderate (conscious) sedation was personally administered by an       anesthesia professional. The following parameters were monitored: oxygen       saturation, heart rate, blood pressure, respiratory rate, EKG, adequacy       of pulmonary ventilation, and response to care. Recommendation:           - Patient has a contact number available for                            emergencies. The signs and symptoms of potential                            delayed complications were discussed with the                             patient. Return to normal activities tomorrow.                            Written discharge instructions were provided to the                            patient.                           - Advance diet as tolerated.                           - Continue present medications. Begin omeprazole 20                            mg daily as previously prescribed. Further                            recommendations to follow pending review of                            pathology report. Office visit with [place] in 3 months Procedure Code(s):        --- Professional ---                           [phone], Esophagogastroduodenoscopy, flexible,                            transoral; diagnostic, including collection of                            specimen(s) by brushing or washing, when performed                            (  separate procedure)                           43450, Dilation of esophagus, by unguided sound or                            bougie, single or multiple passes Diagnosis Code(s):        --- Professional ---                           K44.9, Diaphragmatic hernia without obstruction or                            gangrene                           R13.10, Dysphagia, unspecified CPT copyright 2022 [employer]. All rights reserved. The codes documented in this report are preliminary and upon coder review may  be revised to meet current compliance requirements. [name]. Danna Sewell, MD Norvel Richards, MD 12/25/2021 8:37:42 AM This report has been signed electronically. Number of Addenda: 0

## 2021-12-26 LAB — SURGICAL PATHOLOGY

## 2021-12-27 ENCOUNTER — Encounter (HOSPITAL_COMMUNITY): Payer: Self-pay | Admitting: Internal Medicine

## 2021-12-31 ENCOUNTER — Encounter: Payer: Self-pay | Admitting: Internal Medicine

## 2022-01-11 DIAGNOSIS — F419 Anxiety disorder, unspecified: Secondary | ICD-10-CM | POA: Diagnosis not present

## 2022-01-11 DIAGNOSIS — K589 Irritable bowel syndrome without diarrhea: Secondary | ICD-10-CM | POA: Diagnosis not present

## 2022-01-11 DIAGNOSIS — E7210 Disorders of sulfur-bearing amino-acid metabolism, unspecified: Secondary | ICD-10-CM | POA: Diagnosis not present

## 2022-01-11 DIAGNOSIS — R5383 Other fatigue: Secondary | ICD-10-CM | POA: Diagnosis not present

## 2022-01-21 ENCOUNTER — Telehealth: Payer: BC Managed Care – PPO | Admitting: Nurse Practitioner

## 2022-01-21 DIAGNOSIS — J208 Acute bronchitis due to other specified organisms: Secondary | ICD-10-CM | POA: Diagnosis not present

## 2022-01-21 DIAGNOSIS — B9689 Other specified bacterial agents as the cause of diseases classified elsewhere: Secondary | ICD-10-CM | POA: Diagnosis not present

## 2022-01-21 MED ORDER — AZITHROMYCIN 250 MG PO TABS: ORAL_TABLET | ORAL | 0 refills | Status: AC

## 2022-01-21 MED ORDER — ALBUTEROL SULFATE HFA 108 (90 BASE) MCG/ACT IN AERS: 1.0000 | INHALATION_SPRAY | Freq: Four times a day (QID) | RESPIRATORY_TRACT | 0 refills | Status: AC | PRN

## 2022-01-21 MED ORDER — PSEUDOEPH-BROMPHEN-DM 30-2-10 MG/5ML PO SYRP: 5.0000 mL | ORAL_SOLUTION | Freq: Four times a day (QID) | ORAL | 0 refills | Status: DC | PRN

## 2022-01-21 NOTE — Progress Notes (Signed)
Virtual Visit Consent   Connie [name], you are scheduled for a virtual visit with a [facility] provider today. Just as with appointments in the office, your consent must be obtained to participate. Your consent will be active for this visit and any virtual visit you may have with one of our providers in the next 365 days. If you have a MyChart account, a copy of this consent can be sent to you electronically.  As this is a virtual visit, video technology does not allow for your provider to perform a traditional examination. This may limit your provider's ability to fully assess your condition. If your provider identifies any concerns that need to be evaluated in person or the need to arrange testing (such as labs, EKG, etc.), we will make arrangements to do so. Although advances in technology are sophisticated, we cannot ensure that it will always work on either your end or our end. If the connection with a video visit is poor, the visit may have to be switched to a telephone visit. With either a video or telephone visit, we are not always able to ensure that we have a secure connection.  By engaging in this virtual visit, you consent to the provision of healthcare and authorize for your insurance to be billed (if applicable) for the services provided during this visit. Depending on your insurance coverage, you may receive a charge related to this service.  I need to obtain your verbal consent now. Are you willing to proceed with your visit today? Connie [name] has provided verbal consent on 01/21/2022 for a virtual visit (video or telephone). Gildardo Pounds, NP  Date: 01/21/2022 1:51 PM  Virtual Visit via Video Note   I, Gildardo Pounds, connected with  Connie [name]  ([id number], 26-Feb-1991) on 01/21/22 at  1:45 PM EST by a video-enabled telemedicine application and verified that I am speaking with the correct person using two identifiers.  Location: Patient: Virtual Visit Location Patient:  Home Provider: Virtual Visit Location Provider: Home Office   I discussed the limitations of evaluation and management by telemedicine and the availability of in person appointments. The patient expressed understanding and agreed to proceed.    History of Present Illness: Connie [name] is a 31 y.o. who identifies as a female who was assigned female at birth, and is being seen today for bronchitis.  Acute Bronchitis: Patient presents for presents evaluation of dyspnea, productive cough with sputum described as brown and mucoid, and fatigue . Symptoms began a few weeks ago and are unchanged since that time.  Past history is significant for pneumonia and bronchitis .    Problems:  Patient Active Problem List   Diagnosis Date Noted   Constipation 09/20/2021   Abnormal CT scan, colon 08/20/2019   IBS (irritable bowel syndrome) 08/20/2019   Dysuria 07/10/2019   Pregnancy 05/03/2018   Liver mass 11/25/2015   Abdominal pain, epigastric 11/25/2015   LLQ abdominal pain 11/25/2015   Body mass index between 19-24, adult 06/24/2013    Allergies: No Known Allergies Medications:  Current Outpatient Medications:    azithromycin (ZITHROMAX) 250 MG tablet, Take 2 tablets on day 1, then 1 tablet daily on days 2 through 5, Disp: 6 tablet, Rfl: 0   brompheniramine-pseudoephedrine-DM 30-2-10 MG/5ML syrup, Take 5 mLs by mouth 4 (four) times daily as needed., Disp: 240 mL, Rfl: 0   albuterol (VENTOLIN HFA) 108 (90 Base) MCG/ACT inhaler, Inhale 1-2 puffs into the lungs every 6 (six) hours as needed for  shortness of breath or wheezing., Disp: , Rfl:    Bacillus Coagulans-Inulin (PROBIOTIC FORMULA PO), Take 3 mLs by mouth every evening., Disp: , Rfl:    cetirizine (ZYRTEC) 10 MG tablet, Take 10 mg by mouth daily as needed for allergies., Disp: , Rfl:    cholecalciferol (VITAMIN D) 25 MCG (1000 UNIT) tablet, Take 1,000 Units by mouth daily., Disp: , Rfl:    COLLAGEN PO, Take 10 g by mouth daily. 1 scoop, Disp:  , Rfl:    levonorgestrel (MIRENA, 52 MG,) 20 MCG/DAY IUD, 1 each by Intrauterine route once. Placed in 07/2018, Disp: , Rfl:    MAGNESIUM CITRATE PO, Take 246 mg by mouth every evening. 3 gummies daily, Disp: , Rfl:    MAGNESIUM PO, Take 75 mg by mouth daily. Liquid, Disp: , Rfl:    Multiple Minerals (MULTI-MINERALS PO), Take 30 mLs by mouth every evening., Disp: , Rfl:    Multiple Vitamins-Minerals (MULTIVITAMIN) LIQD, Take 30 mLs by mouth every evening., Disp: , Rfl:    Nutritional Supplements (ADRENAL COMPLEX PO), Take 1 mL by mouth in the morning and at bedtime. BIORAY Adrenal Lover, Disp: , Rfl:    omeprazole (PRILOSEC) 20 MG capsule, Take 1 capsule (20 mg total) by mouth daily before breakfast. Open capsule and place contents into applesauce., Disp: 30 capsule, Rfl: 3   OVER THE COUNTER MEDICATION, Take 1 mL by mouth daily. Lemon Balm for Stress, Can take up to 3 times daily if needed, Disp: , Rfl:    Probiotic Product (DIGESTIVE ADVANTAGE KIDS) CHEW, Chew 2 each by mouth See admin instructions. Take 2 with each meal, up to 3 times daily, Disp: , Rfl:    Probiotic Product (PROBIOTIC DAILY PO), Take 1 capsule by mouth daily. Garden of Life, Disp: , Rfl:    psyllium (METAMUCIL) 58.6 % powder, Take 1 packet by mouth daily., Disp: , Rfl:   Observations/Objective: Patient is well-developed, well-nourished in no acute distress.  Resting comfortably at home.  Head is normocephalic, atraumatic.  No labored breathing.  Speech is clear and coherent with logical content.  Patient is alert and oriented at baseline.    Assessment and Plan: 1. Acute bacterial bronchitis - brompheniramine-pseudoephedrine-DM 30-2-10 MG/5ML syrup; Take 5 mLs by mouth 4 (four) times daily as needed.  Dispense: 240 mL; Refill: 0 - azithromycin (ZITHROMAX) 250 MG tablet; Take 2 tablets on day 1, then 1 tablet daily on days 2 through 5  Dispense: 6 tablet; Refill: 0  INSTRUCTIONS: use a humidifier for nasal  congestion Drink plenty of fluids, rest and wash hands frequently to avoid the spread of infection Alternate tylenol and Motrin for relief of fever   Follow Up Instructions: I discussed the assessment and treatment plan with the patient. The patient was provided an opportunity to ask questions and all were answered. The patient agreed with the plan and demonstrated an understanding of the instructions.  A copy of instructions were sent to the patient via MyChart unless otherwise noted below.    The patient was advised to call back or seek an in-person evaluation if the symptoms worsen or if the condition fails to improve as anticipated.  Time:  I spent 11 minutes with the patient via telehealth technology discussing the above problems/concerns.    Gildardo Pounds, NP

## 2022-01-21 NOTE — Patient Instructions (Signed)
Connie [name], thank you for joining Gildardo Pounds, NP for today's virtual visit.  While this provider is not your primary care provider (PCP), if your PCP is located in our provider database this encounter information will be shared with them immediately following your visit.   [facility] account gives you access to today's visit and all your visits, tests, and labs performed at [facility] " click here if you don't have a [facility] account or go to [email or web address]  Consent: (Patient) Connie [name] provided verbal consent for this virtual visit at the beginning of the encounter.  Current Medications:  Current Outpatient Medications:    azithromycin (ZITHROMAX) 250 MG tablet, Take 2 tablets on day 1, then 1 tablet daily on days 2 through 5, Disp: 6 tablet, Rfl: 0   brompheniramine-pseudoephedrine-DM 30-2-10 MG/5ML syrup, Take 5 mLs by mouth 4 (four) times daily as needed., Disp: 240 mL, Rfl: 0   albuterol (VENTOLIN HFA) 108 (90 Base) MCG/ACT inhaler, Inhale 1-2 puffs into the lungs every 6 (six) hours as needed for shortness of breath or wheezing., Disp: 8 g, Rfl: 0   Bacillus Coagulans-Inulin (PROBIOTIC FORMULA PO), Take 3 mLs by mouth every evening., Disp: , Rfl:    cetirizine (ZYRTEC) 10 MG tablet, Take 10 mg by mouth daily as needed for allergies., Disp: , Rfl:    cholecalciferol (VITAMIN D) 25 MCG (1000 UNIT) tablet, Take 1,000 Units by mouth daily., Disp: , Rfl:    COLLAGEN PO, Take 10 g by mouth daily. 1 scoop, Disp: , Rfl:    levonorgestrel (MIRENA, 52 MG,) 20 MCG/DAY IUD, 1 each by Intrauterine route once. Placed in 07/2018, Disp: , Rfl:    MAGNESIUM CITRATE PO, Take 246 mg by mouth every evening. 3 gummies daily, Disp: , Rfl:    MAGNESIUM PO, Take 75 mg by mouth daily. Liquid, Disp: , Rfl:    Multiple Minerals (MULTI-MINERALS PO), Take 30 mLs by mouth every evening., Disp: , Rfl:    Multiple Vitamins-Minerals (MULTIVITAMIN) LIQD, Take 30  mLs by mouth every evening., Disp: , Rfl:    Nutritional Supplements (ADRENAL COMPLEX PO), Take 1 mL by mouth in the morning and at bedtime. BIORAY Adrenal Lover, Disp: , Rfl:    omeprazole (PRILOSEC) 20 MG capsule, Take 1 capsule (20 mg total) by mouth daily before breakfast. Open capsule and place contents into applesauce., Disp: 30 capsule, Rfl: 3   OVER THE COUNTER MEDICATION, Take 1 mL by mouth daily. Lemon Balm for Stress, Can take up to 3 times daily if needed, Disp: , Rfl:    Probiotic Product (DIGESTIVE ADVANTAGE KIDS) CHEW, Chew 2 each by mouth See admin instructions. Take 2 with each meal, up to 3 times daily, Disp: , Rfl:    Probiotic Product (PROBIOTIC DAILY PO), Take 1 capsule by mouth daily. Garden of Life, Disp: , Rfl:    psyllium (METAMUCIL) 58.6 % powder, Take 1 packet by mouth daily., Disp: , Rfl:    Medications ordered in this encounter:  Meds ordered this encounter  Medications   brompheniramine-pseudoephedrine-DM 30-2-10 MG/5ML syrup    Sig: Take 5 mLs by mouth 4 (four) times daily as needed.    Dispense:  240 mL    Refill:  0    Order Specific Question:   Supervising Provider    Answer:   Chase Picket [[id number]]   azithromycin (ZITHROMAX) 250 MG tablet    Sig: Take 2 tablets on day 1, then 1 tablet daily  on days 2 through 5    Dispense:  6 tablet    Refill:  0    Order Specific Question:   Supervising Provider    Answer:   Chase Picket [[id number]]   albuterol (VENTOLIN HFA) 108 (90 Base) MCG/ACT inhaler    Sig: Inhale 1-2 puffs into the lungs every 6 (six) hours as needed for shortness of breath or wheezing.    Dispense:  8 g    Refill:  0    Order Specific Question:   Supervising Provider    Answer:   Chase Picket [id number]     *If you need refills on other medications prior to your next appointment, please contact your pharmacy*  Follow-Up: Call back or seek an in-person evaluation if the symptoms worsen or if the condition fails to improve as  anticipated.  [facility] [phone]  Other Instructions INSTRUCTIONS: use a humidifier for nasal congestion Drink plenty of fluids, rest and wash hands frequently to avoid the spread of infection Alternate tylenol and Motrin for relief of fever    If you have been instructed to have an in-person evaluation today at a local Urgent Care facility, please use the link below. It will take you to a list of all of our available [facility], including address, phone number and hours of operation. Please do not delay care.  [facility]  If you or a family member do not have a primary care provider, use the link below to schedule a visit and establish care. When you choose a [facility] primary care physician or advanced practice provider, you gain a long-term partner in health. Find a Primary Care Provider  Learn more about [facility] in-office and virtual care options: [facility]

## 2022-01-29 DIAGNOSIS — R87610 Atypical squamous cells of undetermined significance on cytologic smear of cervix (ASC-US): Secondary | ICD-10-CM | POA: Diagnosis not present

## 2022-01-29 DIAGNOSIS — R87612 Low grade squamous intraepithelial lesion on cytologic smear of cervix (LGSIL): Secondary | ICD-10-CM | POA: Diagnosis not present

## 2022-01-29 DIAGNOSIS — Z124 Encounter for screening for malignant neoplasm of cervix: Secondary | ICD-10-CM | POA: Diagnosis not present

## 2022-02-08 ENCOUNTER — Other Ambulatory Visit: Payer: Self-pay

## 2022-02-08 ENCOUNTER — Telehealth: Payer: Self-pay

## 2022-02-08 DIAGNOSIS — R1013 Epigastric pain: Secondary | ICD-10-CM

## 2022-02-08 DIAGNOSIS — K219 Gastro-esophageal reflux disease without esophagitis: Secondary | ICD-10-CM

## 2022-02-08 MED ORDER — OMEPRAZOLE 20 MG PO CPDR: DELAYED_RELEASE_CAPSULE | ORAL | 3 refills | Status: DC

## 2022-02-08 NOTE — Telephone Encounter (Signed)
Refill request for omeprazole dr caps 20 mg qty: 90 [facility] Pt takes once daily. Pt was last seen by Kristen on 12/20/2021. Routing to you in her absence due to you seeing her before [facility].

## 2022-02-08 NOTE — Telephone Encounter (Signed)
Rx sent to [facility]

## 2022-03-05 DIAGNOSIS — S233XXA Sprain of ligaments of thoracic spine, initial encounter: Secondary | ICD-10-CM | POA: Diagnosis not present

## 2022-03-05 DIAGNOSIS — S134XXA Sprain of ligaments of cervical spine, initial encounter: Secondary | ICD-10-CM | POA: Diagnosis not present

## 2022-03-05 DIAGNOSIS — S335XXA Sprain of ligaments of lumbar spine, initial encounter: Secondary | ICD-10-CM | POA: Diagnosis not present

## 2022-03-07 DIAGNOSIS — S134XXA Sprain of ligaments of cervical spine, initial encounter: Secondary | ICD-10-CM | POA: Diagnosis not present

## 2022-03-07 DIAGNOSIS — S233XXA Sprain of ligaments of thoracic spine, initial encounter: Secondary | ICD-10-CM | POA: Diagnosis not present

## 2022-03-07 DIAGNOSIS — S335XXA Sprain of ligaments of lumbar spine, initial encounter: Secondary | ICD-10-CM | POA: Diagnosis not present

## 2022-03-13 ENCOUNTER — Other Ambulatory Visit (HOSPITAL_COMMUNITY): Payer: Self-pay | Admitting: Internal Medicine

## 2022-03-13 DIAGNOSIS — G4489 Other headache syndrome: Secondary | ICD-10-CM

## 2022-03-13 DIAGNOSIS — R202 Paresthesia of skin: Secondary | ICD-10-CM | POA: Diagnosis not present

## 2022-03-13 DIAGNOSIS — R519 Headache, unspecified: Secondary | ICD-10-CM | POA: Diagnosis not present

## 2022-03-13 DIAGNOSIS — Z6820 Body mass index (BMI) 20.0-20.9, adult: Secondary | ICD-10-CM | POA: Diagnosis not present

## 2022-03-16 ENCOUNTER — Ambulatory Visit (HOSPITAL_BASED_OUTPATIENT_CLINIC_OR_DEPARTMENT_OTHER)
Admission: RE | Admit: 2022-03-16 | Discharge: 2022-03-16 | Disposition: A | Discharge status: Home / Self Care | Payer: BC Managed Care – PPO | Source: Ambulatory Visit | Attending: Internal Medicine | Admitting: Internal Medicine

## 2022-03-16 DIAGNOSIS — R202 Paresthesia of skin: Secondary | ICD-10-CM | POA: Insufficient documentation

## 2022-03-16 DIAGNOSIS — G4489 Other headache syndrome: Secondary | ICD-10-CM

## 2022-03-16 DIAGNOSIS — R519 Headache, unspecified: Secondary | ICD-10-CM | POA: Insufficient documentation

## 2022-03-16 DIAGNOSIS — R200 Anesthesia of skin: Secondary | ICD-10-CM | POA: Diagnosis not present

## 2022-03-21 DIAGNOSIS — M9901 Segmental and somatic dysfunction of cervical region: Secondary | ICD-10-CM | POA: Diagnosis not present

## 2022-03-21 DIAGNOSIS — M5412 Radiculopathy, cervical region: Secondary | ICD-10-CM | POA: Diagnosis not present

## 2022-03-21 DIAGNOSIS — M9903 Segmental and somatic dysfunction of lumbar region: Secondary | ICD-10-CM | POA: Diagnosis not present

## 2022-03-21 DIAGNOSIS — M5441 Lumbago with sciatica, right side: Secondary | ICD-10-CM | POA: Diagnosis not present

## 2022-03-23 DIAGNOSIS — E7210 Disorders of sulfur-bearing amino-acid metabolism, unspecified: Secondary | ICD-10-CM | POA: Diagnosis not present

## 2022-03-23 DIAGNOSIS — K219 Gastro-esophageal reflux disease without esophagitis: Secondary | ICD-10-CM | POA: Diagnosis not present

## 2022-03-23 DIAGNOSIS — M9903 Segmental and somatic dysfunction of lumbar region: Secondary | ICD-10-CM | POA: Diagnosis not present

## 2022-03-23 DIAGNOSIS — M5441 Lumbago with sciatica, right side: Secondary | ICD-10-CM | POA: Diagnosis not present

## 2022-03-23 DIAGNOSIS — M5412 Radiculopathy, cervical region: Secondary | ICD-10-CM | POA: Diagnosis not present

## 2022-03-23 DIAGNOSIS — M9901 Segmental and somatic dysfunction of cervical region: Secondary | ICD-10-CM | POA: Diagnosis not present

## 2022-03-23 DIAGNOSIS — F419 Anxiety disorder, unspecified: Secondary | ICD-10-CM | POA: Diagnosis not present

## 2022-03-23 DIAGNOSIS — K589 Irritable bowel syndrome without diarrhea: Secondary | ICD-10-CM | POA: Diagnosis not present

## 2022-03-23 DIAGNOSIS — R5383 Other fatigue: Secondary | ICD-10-CM | POA: Diagnosis not present

## 2022-03-26 DIAGNOSIS — M5412 Radiculopathy, cervical region: Secondary | ICD-10-CM | POA: Diagnosis not present

## 2022-03-26 DIAGNOSIS — M9903 Segmental and somatic dysfunction of lumbar region: Secondary | ICD-10-CM | POA: Diagnosis not present

## 2022-03-26 DIAGNOSIS — M5441 Lumbago with sciatica, right side: Secondary | ICD-10-CM | POA: Diagnosis not present

## 2022-03-26 DIAGNOSIS — M9901 Segmental and somatic dysfunction of cervical region: Secondary | ICD-10-CM | POA: Diagnosis not present

## 2022-03-30 DIAGNOSIS — M5441 Lumbago with sciatica, right side: Secondary | ICD-10-CM | POA: Diagnosis not present

## 2022-03-30 DIAGNOSIS — M9901 Segmental and somatic dysfunction of cervical region: Secondary | ICD-10-CM | POA: Diagnosis not present

## 2022-03-30 DIAGNOSIS — M5412 Radiculopathy, cervical region: Secondary | ICD-10-CM | POA: Diagnosis not present

## 2022-03-30 DIAGNOSIS — M9903 Segmental and somatic dysfunction of lumbar region: Secondary | ICD-10-CM | POA: Diagnosis not present

## 2022-04-01 NOTE — Progress Notes (Unsigned)
[facility]  PATIENT: Connie [name] DOB: December 09, 1990  REFERRING DOCTOR OR PCP: Redmond [facility] MD SOURCE: Patient, notes from primary care, imaging reports, MRI images personally reviewed.  _________________________________   HISTORICAL  CHIEF COMPLAINT:  No chief complaint on file.   HISTORY OF PRESENT ILLNESS:  I had the pleasure of seeing your patient, Connie [name], at [facility] for neurologic consultation regarding her right-sided numbness.  Ms. Grosso is a 32 year old woman who began to experience right-sided numbness about 1 year ago.  ***MRI Cerv +/- thor  Imaging: I personally reviewed the MRI of the brain from 03/16/2022.  It is normal.  REVIEW OF SYSTEMS: Constitutional: No fevers, chills, sweats, or change in appetite Eyes: No visual changes, double vision, eye pain Ear, nose and throat: No hearing loss, ear pain, nasal congestion, sore throat Cardiovascular: No chest pain, palpitations Respiratory:  No shortness of breath at rest or with exertion.   No wheezes GastrointestinaI: No nausea, vomiting, diarrhea, abdominal pain, fecal incontinence Genitourinary:  No dysuria, urinary retention or frequency.  No nocturia. Musculoskeletal:  No neck pain, back pain Integumentary: No rash, pruritus, skin lesions Neurological: as above Psychiatric: No depression at this time.  No anxiety Endocrine: No palpitations, diaphoresis, change in appetite, change in weigh or increased thirst Hematologic/Lymphatic:  No anemia, purpura, petechiae. Allergic/Immunologic: No itchy/runny eyes, nasal congestion, recent allergic reactions, rashes  ALLERGIES: No Known Allergies  HOME MEDICATIONS:  Current Outpatient Medications:    albuterol (VENTOLIN HFA) 108 (90 Base) MCG/ACT inhaler, Inhale 1-2 puffs into the lungs every 6 (six) hours as needed for shortness of breath or wheezing., Disp: 8 g, Rfl: 0   Bacillus Coagulans-Inulin  (PROBIOTIC FORMULA PO), Take 3 mLs by mouth every evening., Disp: , Rfl:    brompheniramine-pseudoephedrine-DM 30-2-10 MG/5ML syrup, Take 5 mLs by mouth 4 (four) times daily as needed., Disp: 240 mL, Rfl: 0   cetirizine (ZYRTEC) 10 MG tablet, Take 10 mg by mouth daily as needed for allergies., Disp: , Rfl:    cholecalciferol (VITAMIN D) 25 MCG (1000 UNIT) tablet, Take 1,000 Units by mouth daily., Disp: , Rfl:    COLLAGEN PO, Take 10 g by mouth daily. 1 scoop, Disp: , Rfl:    levonorgestrel (MIRENA, 52 MG,) 20 MCG/DAY IUD, 1 each by Intrauterine route once. Placed in 07/2018, Disp: , Rfl:    MAGNESIUM CITRATE PO, Take 246 mg by mouth every evening. 3 gummies daily, Disp: , Rfl:    MAGNESIUM PO, Take 75 mg by mouth daily. Liquid, Disp: , Rfl:    Multiple Minerals (MULTI-MINERALS PO), Take 30 mLs by mouth every evening., Disp: , Rfl:    Multiple Vitamins-Minerals (MULTIVITAMIN) LIQD, Take 30 mLs by mouth every evening., Disp: , Rfl:    Nutritional Supplements (ADRENAL COMPLEX PO), Take 1 mL by mouth in the morning and at bedtime. BIORAY Adrenal Lover, Disp: , Rfl:    omeprazole (PRILOSEC) 20 MG capsule, Take 1 capsule (20 mg total) by mouth daily before breakfast. Open capsule and place contents into applesauce., Disp: 90 capsule, Rfl: 3   OVER THE COUNTER MEDICATION, Take 1 mL by mouth daily. Lemon Balm for Stress, Can take up to 3 times daily if needed, Disp: , Rfl:    Probiotic Product (DIGESTIVE ADVANTAGE KIDS) CHEW, Chew 2 each by mouth See admin instructions. Take 2 with each meal, up to 3 times daily, Disp: , Rfl:    Probiotic Product (PROBIOTIC DAILY PO), Take 1 capsule by mouth daily. Garden  of Life, Disp: , Rfl:    psyllium (METAMUCIL) 58.6 % powder, Take 1 packet by mouth daily., Disp: , Rfl:   PAST MEDICAL HISTORY: Past Medical History:  Diagnosis Date   Diverticulitis 06/2019   Focal nodular hyperplasia of liver 2017   Stable imaging x4 years.    Hypoglycemic reaction    IBS  (irritable bowel syndrome)     PAST SURGICAL HISTORY: Past Surgical History:  Procedure Laterality Date   BIOPSY  12/25/2021   Procedure: BIOPSY;  Surgeon: Daneil Dolin, MD;  [facility];  Service: Endoscopy;;   COLONOSCOPY N/A 12/02/2019   Procedure: COLONOSCOPY;  Surgeon: Daneil Dolin, MD; diverticulosis in the sigmoid and descending colon, otherwise normal exam.  Repeat colonoscopy at age 65.   ESOPHAGOGASTRODUODENOSCOPY (EGD) WITH PROPOFOL N/A 12/25/2021   Procedure: ESOPHAGOGASTRODUODENOSCOPY (EGD) WITH PROPOFOL;  Surgeon: Daneil Dolin, MD;  [facility];  Service: Endoscopy;  Laterality: N/A;  8:45 am   MALONEY DILATION N/A 12/25/2021   Procedure: MALONEY DILATION;  Surgeon: Daneil Dolin, MD;  [facility];  Service: Endoscopy;  Laterality: N/A;   WISDOM TOOTH EXTRACTION      FAMILY HISTORY: Family History  Problem Relation Age of Onset   Hypertension Mother    Esophageal cancer Father    Heart attack Other    Colon cancer Neg Hx    Inflammatory bowel disease Neg Hx     SOCIAL HISTORY: Social History   Socioeconomic History   Marital status: Married    Spouse name: Not on file   Number of children: Not on file   Years of education: Not on file   Highest education level: Not on file  Occupational History   Not on file  Tobacco Use   Smoking status: Never   Smokeless tobacco: Never  Vaping Use   Vaping Use: Never used  Substance and Sexual Activity   Alcohol use: No   Drug use: No   Sexual activity: Yes    Birth control/protection: None  Other Topics Concern   Not on file  Social History Narrative   Not on file   Social Determinants of Health   Financial Resource Strain: Not on file  Food Insecurity: Not on file  Transportation Needs: Not on file  Physical Activity: Not on file  Stress: Not on file  Social Connections: Not on file  Intimate Partner Violence: Not on file       PHYSICAL EXAM  There  were no vitals filed for this visit.  There is no height or weight on file to calculate BMI.   General: The patient is well-developed and well-nourished and in no acute distress  HEENT:  Head is [place]/AT.  Sclera are anicteric.  Funduscopic exam shows normal optic discs and retinal vessels.  Neck: No carotid bruits are noted.  The neck is nontender.  Cardiovascular: The heart has a regular rate and rhythm with a normal S1 and S2. There were no murmurs, gallops or rubs.    Skin: Extremities are without rash or  edema.  Musculoskeletal:  Back is nontender  Neurologic Exam  Mental status: The patient is alert and oriented x 3 at the time of the examination. The patient has apparent normal recent and remote memory, with an apparently normal attention span and concentration ability.   Speech is normal.  Cranial nerves: Extraocular movements are full. Pupils are equal, round, and reactive to light and accomodation.  Visual fields are full.  Facial symmetry is present. There is good facial sensation to soft touch bilaterally.Facial strength is normal.  Trapezius and sternocleidomastoid strength is normal. No dysarthria is noted.  The tongue is midline, and the patient has symmetric elevation of the soft palate. No obvious hearing deficits are noted.  Motor:  Muscle bulk is normal.   Tone is normal. Strength is  5 / 5 in all 4 extremities.   Sensory: Sensory testing is intact to pinprick, soft touch and vibration sensation in all 4 extremities.  Coordination: Cerebellar testing reveals good finger-nose-finger and heel-to-shin bilaterally.  Gait and station: Station is normal.   Gait is normal. Tandem gait is normal. Romberg is negative.   Reflexes: Deep tendon reflexes are symmetric and normal bilaterally.   Plantar responses are flexor.    DIAGNOSTIC DATA (LABS, IMAGING, TESTING) - I reviewed patient records, labs, notes, testing and imaging myself where available.  Lab Results   Component Value Date   WBC 8.9 09/26/2019   HGB 15.3 (H) 09/26/2019   HCT 46.4 (H) 09/26/2019   MCV 89.1 09/26/2019   PLT 262 09/26/2019      Component Value Date/Time   NA 138 09/26/2019 1735   K 3.6 09/26/2019 1735   CL 101 09/26/2019 1735   CO2 27 09/26/2019 1735   GLUCOSE 94 09/26/2019 1735   BUN 11 09/26/2019 1735   CREATININE 0.60 09/26/2019 1735   CALCIUM 10.7 (H) 09/26/2019 1735   PROT 8.3 (H) 09/26/2019 1735   ALBUMIN 5.0 09/26/2019 1735   AST 34 09/26/2019 1735   ALT 16 09/26/2019 1735   ALKPHOS 87 09/26/2019 1735   BILITOT 0.8 09/26/2019 1735   GFRNONAA >60 09/26/2019 1735   GFRAA >60 09/26/2019 1735   No results found for: "CHOL", "HDL", "LDLCALC", "LDLDIRECT", "TRIG", "CHOLHDL" No results found for: "HGBA1C" No results found for: "VITAMINB12" Lab Results  Component Value Date   TSH 1.681 02/17/2008       ASSESSMENT AND PLAN  ***   Kelso Bibby A. Felecia Shelling, MD, [facility] 10/29/9242, 6:28 PM Certified in Neurology, Clinical Neurophysiology, Sleep Medicine and Neuroimaging  [facility] [address] [place], [place] [address] [phone]

## 2022-04-02 DIAGNOSIS — M5412 Radiculopathy, cervical region: Secondary | ICD-10-CM | POA: Diagnosis not present

## 2022-04-02 DIAGNOSIS — M9903 Segmental and somatic dysfunction of lumbar region: Secondary | ICD-10-CM | POA: Diagnosis not present

## 2022-04-02 DIAGNOSIS — M9901 Segmental and somatic dysfunction of cervical region: Secondary | ICD-10-CM | POA: Diagnosis not present

## 2022-04-02 DIAGNOSIS — M5441 Lumbago with sciatica, right side: Secondary | ICD-10-CM | POA: Diagnosis not present

## 2022-04-03 DIAGNOSIS — G5600 Carpal tunnel syndrome, unspecified upper limb: Secondary | ICD-10-CM | POA: Insufficient documentation

## 2022-04-03 DIAGNOSIS — Z309 Encounter for contraceptive management, unspecified: Secondary | ICD-10-CM | POA: Insufficient documentation

## 2022-04-03 DIAGNOSIS — M25539 Pain in unspecified wrist: Secondary | ICD-10-CM | POA: Insufficient documentation

## 2022-04-03 DIAGNOSIS — Z711 Person with feared health complaint in whom no diagnosis is made: Secondary | ICD-10-CM | POA: Insufficient documentation

## 2022-04-03 DIAGNOSIS — Z7189 Other specified counseling: Secondary | ICD-10-CM | POA: Insufficient documentation

## 2022-04-03 DIAGNOSIS — M7650 Patellar tendinitis, unspecified knee: Secondary | ICD-10-CM | POA: Insufficient documentation

## 2022-04-03 DIAGNOSIS — Z3009 Encounter for other general counseling and advice on contraception: Secondary | ICD-10-CM | POA: Insufficient documentation

## 2022-04-03 DIAGNOSIS — R195 Other fecal abnormalities: Secondary | ICD-10-CM | POA: Insufficient documentation

## 2022-04-03 DIAGNOSIS — R21 Rash and other nonspecific skin eruption: Secondary | ICD-10-CM | POA: Insufficient documentation

## 2022-04-03 DIAGNOSIS — N63 Unspecified lump in unspecified breast: Secondary | ICD-10-CM | POA: Insufficient documentation

## 2022-04-03 DIAGNOSIS — Z0289 Encounter for other administrative examinations: Secondary | ICD-10-CM | POA: Insufficient documentation

## 2022-04-04 ENCOUNTER — Encounter: Payer: Self-pay | Admitting: Neurology

## 2022-04-04 ENCOUNTER — Ambulatory Visit (INDEPENDENT_AMBULATORY_CARE_PROVIDER_SITE_OTHER): Payer: BC Managed Care – PPO | Admitting: Neurology

## 2022-04-04 VITALS — BP 104/70 | HR 85 | Ht 62.0 in | Wt 109.6 lb

## 2022-04-04 DIAGNOSIS — G5620 Lesion of ulnar nerve, unspecified upper limb: Secondary | ICD-10-CM

## 2022-04-04 DIAGNOSIS — R200 Anesthesia of skin: Secondary | ICD-10-CM

## 2022-04-05 ENCOUNTER — Telehealth: Payer: Self-pay | Admitting: Neurology

## 2022-04-05 NOTE — Telephone Encounter (Signed)
Pt scheduled for MR cervical wo and MR thoracic spine wo contrast at [facility] for 04/10/22 at [facility]#[id number] (exp.04/05/22-05/04/22)

## 2022-04-06 DIAGNOSIS — M5412 Radiculopathy, cervical region: Secondary | ICD-10-CM | POA: Diagnosis not present

## 2022-04-06 DIAGNOSIS — M5441 Lumbago with sciatica, right side: Secondary | ICD-10-CM | POA: Diagnosis not present

## 2022-04-06 DIAGNOSIS — M9901 Segmental and somatic dysfunction of cervical region: Secondary | ICD-10-CM | POA: Diagnosis not present

## 2022-04-06 DIAGNOSIS — M9903 Segmental and somatic dysfunction of lumbar region: Secondary | ICD-10-CM | POA: Diagnosis not present

## 2022-04-09 DIAGNOSIS — M5441 Lumbago with sciatica, right side: Secondary | ICD-10-CM | POA: Diagnosis not present

## 2022-04-09 DIAGNOSIS — M5412 Radiculopathy, cervical region: Secondary | ICD-10-CM | POA: Diagnosis not present

## 2022-04-09 DIAGNOSIS — M9903 Segmental and somatic dysfunction of lumbar region: Secondary | ICD-10-CM | POA: Diagnosis not present

## 2022-04-09 DIAGNOSIS — M9901 Segmental and somatic dysfunction of cervical region: Secondary | ICD-10-CM | POA: Diagnosis not present

## 2022-04-10 ENCOUNTER — Ambulatory Visit (INDEPENDENT_AMBULATORY_CARE_PROVIDER_SITE_OTHER): Payer: BC Managed Care – PPO

## 2022-04-10 DIAGNOSIS — R200 Anesthesia of skin: Secondary | ICD-10-CM | POA: Diagnosis not present

## 2022-04-10 DIAGNOSIS — R5383 Other fatigue: Secondary | ICD-10-CM | POA: Diagnosis not present

## 2022-04-10 DIAGNOSIS — E7210 Disorders of sulfur-bearing amino-acid metabolism, unspecified: Secondary | ICD-10-CM | POA: Diagnosis not present

## 2022-04-10 DIAGNOSIS — K589 Irritable bowel syndrome without diarrhea: Secondary | ICD-10-CM | POA: Diagnosis not present

## 2022-04-10 DIAGNOSIS — F419 Anxiety disorder, unspecified: Secondary | ICD-10-CM | POA: Diagnosis not present

## 2022-04-11 ENCOUNTER — Encounter: Payer: Self-pay | Admitting: Neurology

## 2022-04-16 DIAGNOSIS — M5412 Radiculopathy, cervical region: Secondary | ICD-10-CM | POA: Diagnosis not present

## 2022-04-16 DIAGNOSIS — M9903 Segmental and somatic dysfunction of lumbar region: Secondary | ICD-10-CM | POA: Diagnosis not present

## 2022-04-16 DIAGNOSIS — M9901 Segmental and somatic dysfunction of cervical region: Secondary | ICD-10-CM | POA: Diagnosis not present

## 2022-04-16 DIAGNOSIS — M5441 Lumbago with sciatica, right side: Secondary | ICD-10-CM | POA: Diagnosis not present

## 2022-04-16 NOTE — Progress Notes (Unsigned)
Referring Provider: [facility] Primary Care Physician:  [facility] Primary GI Physician: Dr. Gala Romney  No chief complaint on file.   HPI:   Connie [name] is a 32 y.o. female with history of IBS, large right lobe liver lesion consistent with [place] with interval decrease in size on recent CT in [place] 2023, likely episode of diverticulitis in May 2021 with follow-up colonoscopy in October 2021 with diverticulosis in sigmoid and descending colon, otherwise normal exam, presenting today for follow-up of dysphagia, heartburn, epigastric burning.  Last seen in our office 12/20/2021.  Reported having a couple episodes of mild lower abdominal pain, but nothing persistent, improved by bone broth.  Bowels moving well with psyllium 1 tablespoon daily and MiraLAX as needed.  Noted sensation of food getting hung in her throat having to cough them back up at times for the last 3 months.  Also with significantly increased stress recently as her father was diagnosed with stage IV esophageal cancer.  Reported a lot of anxiety related to this.  Also with new onset heartburn over the last few weeks occurring daily as well as midepigastric burning.  Recommended starting omeprazole 20 mg daily and proceeding with an EGD.  12/25/21: Normal esophagus s/p dilation and biopsy, small hiatal hernia.  Recommended starting omeprazole as previously prescribed.  Pathology was benign.  Today:    Past Medical History:  Diagnosis Date   Diverticulitis 06/2019   Focal nodular hyperplasia of liver 2017   Stable imaging x4 years.    Hypoglycemic reaction    IBS (irritable bowel syndrome)     Past Surgical History:  Procedure Laterality Date   BIOPSY  12/25/2021   Procedure: BIOPSY;  Surgeon: Daneil Dolin, MD;  [facility];  Service: Endoscopy;;   COLONOSCOPY N/A 12/02/2019   Procedure: COLONOSCOPY;  Surgeon: Daneil Dolin, MD; diverticulosis in the sigmoid and  descending colon, otherwise normal exam.  Repeat colonoscopy at age 24.   ESOPHAGOGASTRODUODENOSCOPY (EGD) WITH PROPOFOL N/A 12/25/2021   Procedure: ESOPHAGOGASTRODUODENOSCOPY (EGD) WITH PROPOFOL;  Surgeon: Daneil Dolin, MD;  [facility];  Service: Endoscopy;  Laterality: N/A;  8:45 am   MALONEY DILATION N/A 12/25/2021   Procedure: MALONEY DILATION;  Surgeon: Daneil Dolin, MD;  [facility];  Service: Endoscopy;  Laterality: N/A;   WISDOM TOOTH EXTRACTION      Current Outpatient Medications  Medication Sig Dispense Refill   albuterol (VENTOLIN HFA) 108 (90 Base) MCG/ACT inhaler Inhale 1-2 puffs into the lungs every 6 (six) hours as needed for shortness of breath or wheezing. 8 g 0   Bacillus Coagulans-Inulin (PROBIOTIC FORMULA PO) Take 3 mLs by mouth every evening.     brompheniramine-pseudoephedrine-DM 30-2-10 MG/5ML syrup Take 5 mLs by mouth 4 (four) times daily as needed. 240 mL 0   cetirizine (ZYRTEC) 10 MG tablet Take 10 mg by mouth daily as needed for allergies.     cholecalciferol (VITAMIN D) 25 MCG (1000 UNIT) tablet Take 1,000 Units by mouth daily.     COLLAGEN PO Take 10 g by mouth daily. 1 scoop     levonorgestrel (MIRENA, 52 MG,) 20 MCG/DAY IUD 1 each by Intrauterine route once. Placed in 07/2018     MAGNESIUM CITRATE PO Take 246 mg by mouth every evening. 3 gummies daily (Patient not taking: Reported on 04/04/2022)     MAGNESIUM PO Take 75 mg by mouth daily. Liquid     Multiple Minerals (MULTI-MINERALS  PO) Take 30 mLs by mouth every evening. (Patient not taking: Reported on 04/04/2022)     Multiple Vitamins-Minerals (MULTIVITAMIN) LIQD Take 30 mLs by mouth every evening.     Nutritional Supplements (ADRENAL COMPLEX PO) Take 1 mL by mouth in the morning and at bedtime. BIORAY Adrenal Lover     omeprazole (PRILOSEC) 20 MG capsule Take 1 capsule (20 mg total) by mouth daily before breakfast. Open capsule and place contents into applesauce. 90 capsule 3   OVER  THE COUNTER MEDICATION Take 1 mL by mouth daily. Lemon Balm for Stress, Can take up to 3 times daily if needed     Probiotic Product (DIGESTIVE ADVANTAGE KIDS) CHEW Chew 2 each by mouth See admin instructions. Take 2 with each meal, up to 3 times daily     Probiotic Product (PROBIOTIC DAILY PO) Take 1 capsule by mouth daily. Garden of Life     psyllium (METAMUCIL) 58.6 % powder Take 1 packet by mouth daily.     No current facility-administered medications for this visit.    Allergies as of 04/18/2022   (No Known Allergies)    Family History  Problem Relation Age of Onset   Hypertension Mother    Esophageal cancer Father    Hypertension Sister    Heart attack Other    Colon cancer Neg Hx    Inflammatory bowel disease Neg Hx     Social History   Socioeconomic History   Marital status: Married    Spouse name: Not on file   Number of children: Not on file   Years of education: Not on file   Highest education level: Not on file  Occupational History   Not on file  Tobacco Use   Smoking status: Never   Smokeless tobacco: Never  Vaping Use   Vaping Use: Never used  Substance and Sexual Activity   Alcohol use: No   Drug use: No   Sexual activity: Yes    Birth control/protection: None  Other Topics Concern   Not on file  Social History Narrative   Right handed   Wears prescription glasses    Not a caffeine drinker    Social Determinants of Health   Financial Resource Strain: Not on file  Food Insecurity: Not on file  Transportation Needs: Not on file  Physical Activity: Not on file  Stress: Not on file  Social Connections: Not on file    Review of Systems: Gen: Denies fever, chills, anorexia. Denies fatigue, weakness, weight loss.  CV: Denies chest pain, palpitations, syncope, peripheral edema, and claudication. Resp: Denies dyspnea at rest, cough, wheezing, coughing up blood, and pleurisy. GI: Denies vomiting blood, jaundice, and fecal incontinence.   Denies  dysphagia or odynophagia. Derm: Denies rash, itching, dry skin Psych: Denies depression, anxiety, memory loss, confusion. No homicidal or suicidal ideation.  Heme: Denies bruising, bleeding, and enlarged lymph nodes.  Physical Exam: There were no vitals taken for this visit. General:   Alert and oriented. No distress noted. Pleasant and cooperative.  Head:  Normocephalic and atraumatic. Eyes:  Conjuctiva clear without scleral icterus. Heart:  S1, S2 present without murmurs appreciated. Lungs:  Clear to auscultation bilaterally. No wheezes, rales, or rhonchi. No distress.  Abdomen:  +BS, soft, non-tender and non-distended. No rebound or guarding. No HSM or masses noted. Msk:  Symmetrical without gross deformities. Normal posture. Extremities:  Without edema. Neurologic:  Alert and  oriented x4 Psych:  Normal mood and affect.    Assessment:  Plan:  ***   Aliene Altes, PA-C [facility] 04/18/2022

## 2022-04-18 ENCOUNTER — Encounter: Payer: Self-pay | Admitting: Gastroenterology

## 2022-04-18 ENCOUNTER — Ambulatory Visit (INDEPENDENT_AMBULATORY_CARE_PROVIDER_SITE_OTHER): Payer: BC Managed Care – PPO | Admitting: Gastroenterology

## 2022-04-18 VITALS — BP 96/68 | HR 79 | Temp 97.6°F | Ht 62.0 in | Wt 113.2 lb

## 2022-04-18 DIAGNOSIS — R1310 Dysphagia, unspecified: Secondary | ICD-10-CM

## 2022-04-18 DIAGNOSIS — K219 Gastro-esophageal reflux disease without esophagitis: Secondary | ICD-10-CM

## 2022-04-18 NOTE — Patient Instructions (Signed)
Stop omeprazole and start Pepcid (famotidine) 20 mg daily for the next 4 weeks. If you are feeling well after 4 weeks, you can try tapering off again. If you are still having reflux symptoms with pepcid daily, please let me know.   Follow a GERD diet:  Avoid fried, fatty, greasy, spicy, citrus foods. Avoid caffeine and carbonated beverages. Avoid chocolate. Try eating 4-6 small meals a day rather than 3 large meals. Do not eat within 3 hours of laying down. Prop head of bed up on wood or bricks to create a 6 inch incline.   We will see you back in 3 months. Do not hesitate to call sooner if you have questions or concerns.   It was a pleasure to see you today! I want to create trusting relationships with patients. If you receive a survey regarding your visit,  I greatly appreciate you taking time to fill this out on paper or through your MyChart. I value your feedback.  Aliene Altes, PA-C [facility]

## 2022-04-27 DIAGNOSIS — M9903 Segmental and somatic dysfunction of lumbar region: Secondary | ICD-10-CM | POA: Diagnosis not present

## 2022-04-27 DIAGNOSIS — M5441 Lumbago with sciatica, right side: Secondary | ICD-10-CM | POA: Diagnosis not present

## 2022-04-27 DIAGNOSIS — M5412 Radiculopathy, cervical region: Secondary | ICD-10-CM | POA: Diagnosis not present

## 2022-04-27 DIAGNOSIS — M9901 Segmental and somatic dysfunction of cervical region: Secondary | ICD-10-CM | POA: Diagnosis not present

## 2022-05-07 DIAGNOSIS — D2262 Melanocytic nevi of left upper limb, including shoulder: Secondary | ICD-10-CM | POA: Diagnosis not present

## 2022-05-07 DIAGNOSIS — L858 Other specified epidermal thickening: Secondary | ICD-10-CM | POA: Diagnosis not present

## 2022-05-07 DIAGNOSIS — D2261 Melanocytic nevi of right upper limb, including shoulder: Secondary | ICD-10-CM | POA: Diagnosis not present

## 2022-05-07 DIAGNOSIS — L814 Other melanin hyperpigmentation: Secondary | ICD-10-CM | POA: Diagnosis not present

## 2022-05-11 DIAGNOSIS — M9901 Segmental and somatic dysfunction of cervical region: Secondary | ICD-10-CM | POA: Diagnosis not present

## 2022-05-11 DIAGNOSIS — M5441 Lumbago with sciatica, right side: Secondary | ICD-10-CM | POA: Diagnosis not present

## 2022-05-11 DIAGNOSIS — M9903 Segmental and somatic dysfunction of lumbar region: Secondary | ICD-10-CM | POA: Diagnosis not present

## 2022-05-11 DIAGNOSIS — M5412 Radiculopathy, cervical region: Secondary | ICD-10-CM | POA: Diagnosis not present

## 2022-05-24 DIAGNOSIS — M9901 Segmental and somatic dysfunction of cervical region: Secondary | ICD-10-CM | POA: Diagnosis not present

## 2022-05-24 DIAGNOSIS — M9903 Segmental and somatic dysfunction of lumbar region: Secondary | ICD-10-CM | POA: Diagnosis not present

## 2022-05-24 DIAGNOSIS — M5412 Radiculopathy, cervical region: Secondary | ICD-10-CM | POA: Diagnosis not present

## 2022-05-24 DIAGNOSIS — M5441 Lumbago with sciatica, right side: Secondary | ICD-10-CM | POA: Diagnosis not present

## 2022-06-14 ENCOUNTER — Telehealth: Payer: BC Managed Care – PPO | Admitting: Family Medicine

## 2022-06-14 DIAGNOSIS — R21 Rash and other nonspecific skin eruption: Secondary | ICD-10-CM

## 2022-06-14 MED ORDER — TRIAMCINOLONE ACETONIDE 0.025 % EX OINT: 1.0000 | TOPICAL_OINTMENT | Freq: Three times a day (TID) | CUTANEOUS | 0 refills | Status: DC

## 2022-06-14 MED ORDER — MUPIROCIN CALCIUM 2 % EX CREA: 1.0000 | TOPICAL_CREAM | Freq: Two times a day (BID) | CUTANEOUS | 0 refills | Status: DC

## 2022-06-14 NOTE — Progress Notes (Signed)
Virtual Visit Consent   Connie [name], you are scheduled for a virtual visit with a [facility] provider today. Just as with appointments in the office, your consent must be obtained to participate. Your consent will be active for this visit and any virtual visit you may have with one of our providers in the next 365 days. If you have a MyChart account, a copy of this consent can be sent to you electronically.  As this is a virtual visit, video technology does not allow for your provider to perform a traditional examination. This may limit your provider's ability to fully assess your condition. If your provider identifies any concerns that need to be evaluated in person or the need to arrange testing (such as labs, EKG, etc.), we will make arrangements to do so. Although advances in technology are sophisticated, we cannot ensure that it will always work on either your end or our end. If the connection with a video visit is poor, the visit may have to be switched to a telephone visit. With either a video or telephone visit, we are not always able to ensure that we have a secure connection.  By engaging in this virtual visit, you consent to the provision of healthcare and authorize for your insurance to be billed (if applicable) for the services provided during this visit. Depending on your insurance coverage, you may receive a charge related to this service.  I need to obtain your verbal consent now. Are you willing to proceed with your visit today? Connie [name] has provided verbal consent on 06/14/2022 for a virtual visit (video or telephone). Freddy Finner, NP  Date: 06/14/2022 10:27 AM  Virtual Visit via Video Note   I, Freddy Finner, connected with  Connie [name]  ([id number], [birth year]) on 06/14/22 at 10:30 AM EDT by a video-enabled telemedicine application and verified that I am speaking with the correct person using two identifiers.  Location: Patient: Virtual Visit Location Patient:  Home Provider: Virtual Visit Location Provider: Home Office   I discussed the limitations of evaluation and management by telemedicine and the availability of in person appointments. The patient expressed understanding and agreed to proceed.    History of Present Illness: Connie [name] is a 32 y.o. who identifies as a female who was assigned female at birth, and is being seen today for facial rash.  Used new skin care products- Saturday- homemade from a friend, plus use of microneedling rollers. Rash started along lower cheek, and jaw line, and neck. Itching- bumping, mildly red and flesh color, some appear to be fluid filled. Tried Bactroban, hydrocortisone cream and antibiotic soap. Denies fevers, chills, no other areas, or signs of infection/ cellulitis  Problems:  Patient Active Problem List   Diagnosis Date Noted   Gastroesophageal reflux disease 04/18/2022   Carpal tunnel syndrome 04/03/2022   Contraceptive management 04/03/2022   General counseling for prescription of oral contraceptives 04/03/2022   Other specified counseling 04/03/2022   Health examination of defined subpopulation 04/03/2022   Lump or mass in breast 04/03/2022   Nonspecific abnormal finding in stool contents 04/03/2022   Pain in joint, forearm 04/03/2022   Patellar tendinitis 04/03/2022   Person with feared complaint in whom no diagnosis was made 04/03/2022   Rash and other nonspecific skin eruption 04/03/2022   Constipation 09/20/2021   Abnormal CT scan, colon 08/20/2019   IBS (irritable bowel syndrome) 08/20/2019   Dysuria 07/10/2019   Diverticulitis 06/29/2019   Pregnancy 05/03/2018   Focal nodular  hyperplasia of liver 04/16/2016   Liver mass 11/25/2015   Abdominal pain, epigastric 11/25/2015   LLQ abdominal pain 11/25/2015   Body mass index between 19-24, adult 06/24/2013    Allergies: No Known Allergies Medications:  Current Outpatient Medications:    albuterol (VENTOLIN HFA) 108 (90 Base)  MCG/ACT inhaler, Inhale 1-2 puffs into the lungs every 6 (six) hours as needed for shortness of breath or wheezing., Disp: 8 g, Rfl: 0   Bacillus Coagulans-Inulin (PROBIOTIC FORMULA PO), Take 3 mLs by mouth every evening., Disp: , Rfl:    brompheniramine-pseudoephedrine-DM 30-2-10 MG/5ML syrup, Take 5 mLs by mouth 4 (four) times daily as needed. (Patient not taking: Reported on 04/18/2022), Disp: 240 mL, Rfl: 0   cetirizine (ZYRTEC) 10 MG tablet, Take 10 mg by mouth daily as needed for allergies., Disp: , Rfl:    cholecalciferol (VITAMIN D) 25 MCG (1000 UNIT) tablet, Take 1,000 Units by mouth daily., Disp: , Rfl:    COLLAGEN PO, Take 10 g by mouth daily. 1 scoop, Disp: , Rfl:    levonorgestrel (MIRENA, 52 MG,) 20 MCG/DAY IUD, 1 each by Intrauterine route once. Placed in 07/2018, Disp: , Rfl:    MAGNESIUM CITRATE PO, Take 246 mg by mouth every evening. 3 gummies daily (Patient not taking: Reported on 04/04/2022), Disp: , Rfl:    MAGNESIUM PO, Take 75 mg by mouth daily. Liquid, Disp: , Rfl:    Multiple Minerals (MULTI-MINERALS PO), Take 30 mLs by mouth every evening. (Patient not taking: Reported on 04/18/2022), Disp: , Rfl:    Multiple Vitamins-Minerals (MULTIVITAMIN) LIQD, Take 30 mLs by mouth every evening., Disp: , Rfl:    Nutritional Supplements (ADRENAL COMPLEX PO), Take 1 mL by mouth in the morning and at bedtime. BIORAY Adrenal Lover, Disp: , Rfl:    OVER THE COUNTER MEDICATION, Take 1 mL by mouth daily. Lemon Balm for Stress, Can take up to 3 times daily if needed, Disp: , Rfl:    Probiotic Product (DIGESTIVE ADVANTAGE KIDS) CHEW, Chew 2 each by mouth See admin instructions. Take 2 with each meal, up to 3 times daily, Disp: , Rfl:    Probiotic Product (PROBIOTIC DAILY PO), Take 1 capsule by mouth daily. Garden of Life, Disp: , Rfl:    psyllium (METAMUCIL) 58.6 % powder, Take 1 packet by mouth daily., Disp: , Rfl:   Observations/Objective: Patient is well-developed, well-nourished in no acute  distress.  Resting comfortably at home.  Head is normocephalic, atraumatic.  No labored breathing. Speech is clear and coherent with logical content.  Patient is alert and oriented at baseline.  Noted- facial rash along cheeks, jawline, under jaw and on neck, as well as forehead- bumpy- red and flesh colored.  Assessment and Plan: 1. Rash of face  - triamcinolone (KENALOG) 0.025 % ointment; Apply 1 Application topically 3 (three) times daily.  Dispense: 30 g; Refill: 0 - mupirocin cream (BACTROBAN) 2 %; Apply 1 Application topically 2 (two) times daily.  Dispense: 15 g; Refill: 0  -questionable allergic reaction to new skin care products -history of impetigo but do not feel this is that, but given that will cover for topical staph treatment -itching and spreading- but still localized will use Kenalog for now- and avoid oral prednisone -follow up based on improvement- discussed signs of worsening infection vs allergy -keep skin clean -change pillowcase often -avoid sun- sunscreen -no make up (clean brushes used in last 3 days) -hydrate well  Reviewed side effects, risks and benefits of medication.  Patient acknowledged agreement and understanding of the plan.   Past Medical, Surgical, Social History, Allergies, and Medications have been Reviewed.     Follow Up Instructions: I discussed the assessment and treatment plan with the patient. The patient was provided an opportunity to ask questions and all were answered. The patient agreed with the plan and demonstrated an understanding of the instructions.  A copy of instructions were sent to the patient via MyChart unless otherwise noted below.    The patient was advised to call back or seek an in-person evaluation if the symptoms worsen or if the condition fails to improve as anticipated.  Time:  I spent 10 minutes with the patient via telehealth technology discussing the above problems/concerns.    Freddy Finner, NP

## 2022-06-14 NOTE — Patient Instructions (Signed)
Connie [name], thank you for joining Freddy Finner, NP for today's virtual visit.  While this provider is not your primary care provider (PCP), if your PCP is located in our provider database this encounter information will be shared with them immediately following your visit.   A [facility] MyChart account gives you access to today's visit and all your visits, tests, and labs performed at [facility] " click here if you don't have a [facility] MyChart account or go to [email or web address]  Consent: (Patient) Connie [name] provided verbal consent for this virtual visit at the beginning of the encounter.  Current Medications:  Current Outpatient Medications:    mupirocin cream (BACTROBAN) 2 %, Apply 1 Application topically 2 (two) times daily., Disp: 15 g, Rfl: 0   triamcinolone (KENALOG) 0.025 % ointment, Apply 1 Application topically 3 (three) times daily., Disp: 30 g, Rfl: 0   albuterol (VENTOLIN HFA) 108 (90 Base) MCG/ACT inhaler, Inhale 1-2 puffs into the lungs every 6 (six) hours as needed for shortness of breath or wheezing., Disp: 8 g, Rfl: 0   Bacillus Coagulans-Inulin (PROBIOTIC FORMULA PO), Take 3 mLs by mouth every evening., Disp: , Rfl:    brompheniramine-pseudoephedrine-DM 30-2-10 MG/5ML syrup, Take 5 mLs by mouth 4 (four) times daily as needed. (Patient not taking: Reported on 04/18/2022), Disp: 240 mL, Rfl: 0   cetirizine (ZYRTEC) 10 MG tablet, Take 10 mg by mouth daily as needed for allergies., Disp: , Rfl:    cholecalciferol (VITAMIN D) 25 MCG (1000 UNIT) tablet, Take 1,000 Units by mouth daily., Disp: , Rfl:    COLLAGEN PO, Take 10 g by mouth daily. 1 scoop, Disp: , Rfl:    levonorgestrel (MIRENA, 52 MG,) 20 MCG/DAY IUD, 1 each by Intrauterine route once. Placed in 07/2018, Disp: , Rfl:    MAGNESIUM CITRATE PO, Take 246 mg by mouth every evening. 3 gummies daily (Patient not taking: Reported on 04/04/2022), Disp: , Rfl:    MAGNESIUM PO, Take 75 mg by mouth  daily. Liquid, Disp: , Rfl:    Multiple Minerals (MULTI-MINERALS PO), Take 30 mLs by mouth every evening. (Patient not taking: Reported on 04/18/2022), Disp: , Rfl:    Multiple Vitamins-Minerals (MULTIVITAMIN) LIQD, Take 30 mLs by mouth every evening., Disp: , Rfl:    Nutritional Supplements (ADRENAL COMPLEX PO), Take 1 mL by mouth in the morning and at bedtime. BIORAY Adrenal Lover, Disp: , Rfl:    OVER THE COUNTER MEDICATION, Take 1 mL by mouth daily. Lemon Balm for Stress, Can take up to 3 times daily if needed, Disp: , Rfl:    Probiotic Product (DIGESTIVE ADVANTAGE KIDS) CHEW, Chew 2 each by mouth See admin instructions. Take 2 with each meal, up to 3 times daily, Disp: , Rfl:    Probiotic Product (PROBIOTIC DAILY PO), Take 1 capsule by mouth daily. Garden of Life, Disp: , Rfl:    psyllium (METAMUCIL) 58.6 % powder, Take 1 packet by mouth daily., Disp: , Rfl:    Medications ordered in this encounter:  Meds ordered this encounter  Medications   triamcinolone (KENALOG) 0.025 % ointment    Sig: Apply 1 Application topically 3 (three) times daily.    Dispense:  30 g    Refill:  0    Order Specific Question:   Supervising Provider    Answer:   Merrilee Jansky [[id number]]   mupirocin cream (BACTROBAN) 2 %    Sig: Apply 1 Application topically 2 (two) times daily.  Dispense:  15 g    Refill:  0    Order Specific Question:   Supervising Provider    Answer:   Merrilee Jansky [id number]     *If you need refills on other medications prior to your next appointment, please contact your pharmacy*  Follow-Up: Call back or seek an in-person evaluation if the symptoms worsen or if the condition fails to improve as anticipated.  [facility] [phone]  Other Instructions  -questionable allergic reaction to new skin care products -history of impetigo but do not feel this is that, but given that will cover for topical staph treatment -itching and spreading- but still localized  will use Kenalog for now- and avoid oral prednisone -follow up based on improvement- discussed signs of worsening infection vs allergy -keep skin clean -change pillowcase often -avoid sun- sunscreen -no make up (clean brushes used in last 3 days) -hydrate well  If you have been instructed to have an in-person evaluation today at a local Urgent Care facility, please use the link below. It will take you to a list of all of our available [facility], including address, phone number and hours of operation. Please do not delay care.  [facility]  If you or a family member do not have a primary care provider, use the link below to schedule a visit and establish care. When you choose a [place] primary care physician or advanced practice provider, you gain a long-term partner in health. Find a Primary Care Provider  Learn more about [facility] in-office and virtual care options:  - Get Care Now

## 2022-06-22 DIAGNOSIS — M9901 Segmental and somatic dysfunction of cervical region: Secondary | ICD-10-CM | POA: Diagnosis not present

## 2022-06-22 DIAGNOSIS — M5412 Radiculopathy, cervical region: Secondary | ICD-10-CM | POA: Diagnosis not present

## 2022-06-22 DIAGNOSIS — M5441 Lumbago with sciatica, right side: Secondary | ICD-10-CM | POA: Diagnosis not present

## 2022-06-22 DIAGNOSIS — M9903 Segmental and somatic dysfunction of lumbar region: Secondary | ICD-10-CM | POA: Diagnosis not present

## 2022-07-08 NOTE — Progress Notes (Unsigned)
Referring Provider: [facility] Primary Care Physician:  [facility] Primary GI Physician: Dr. Jena Gauss  No chief complaint on file.   HPI:   Connie [name] is a 32 y.o. 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   Last seen in our office 04/18/2022.  Reported prior dysphagia improved with esophageal dilation.  Reported prior reflux had improved with omeprazole 20 mg daily, then felt omeprazole was causing a lump in her throat.  She tapered off omeprazole for a while and did well, but had some increased frequency of reflux recently, which she felt was secondary to stress.  Start taking omeprazole daily about 1 week ago with improvement in reflux, but also with recurrent globus requesting to try something different, specifically preferred H2 blocker.  Plan to stop omeprazole and start Pepcid 20 mg daily for the next 4 weeks.  If feeling well, can try tapering off of Pepcid.  Recommended 10-month follow-up.  Today:    Past Medical History:  Diagnosis Date   Diverticulitis 06/2019   Focal nodular hyperplasia of liver 2017   Stable imaging x4 years.    Hypoglycemic reaction    IBS (irritable bowel syndrome)     Past Surgical History:  Procedure Laterality Date   BIOPSY  12/25/2021   Procedure: BIOPSY;  Surgeon: Corbin Ade, MD;  [facility]: [facility];  Service: Endoscopy;;   COLONOSCOPY N/A 12/02/2019   Procedure: COLONOSCOPY;  Surgeon: Corbin Ade, MD; diverticulosis in the sigmoid and descending colon, otherwise normal exam.  Repeat colonoscopy at age 50.   ESOPHAGOGASTRODUODENOSCOPY (EGD) WITH PROPOFOL N/A 12/25/2021    Surgeon: Corbin Ade, MD; Normal esophagus s/p dilation and biopsy, small hiatal hernia.   MALONEY DILATION N/A 12/25/2021   Procedure: Elease Hashimoto DILATION;  Surgeon: Corbin Ade, MD;  Location: [facility];  Service: Endoscopy;  Laterality: N/A;   WISDOM TOOTH EXTRACTION      Current Outpatient Medications  Medication Sig Dispense Refill   albuterol (VENTOLIN HFA) 108 (90 Base) MCG/ACT inhaler Inhale 1-2 puffs into the lungs every 6 (six) hours as needed for shortness of breath or wheezing. 8 g 0   Bacillus Coagulans-Inulin (PROBIOTIC FORMULA PO) Take 3 mLs by mouth every evening.     brompheniramine-pseudoephedrine-DM 30-2-10 MG/5ML syrup Take 5 mLs by mouth 4 (four) times daily as needed. (Patient not taking: Reported on 04/18/2022) 240 mL 0   cetirizine (ZYRTEC) 10 MG tablet Take 10 mg by mouth daily as needed for allergies.     cholecalciferol (VITAMIN D) 25 MCG (1000 UNIT) tablet Take 1,000 Units by mouth daily.     COLLAGEN PO Take 10 g by mouth daily. 1 scoop     levonorgestrel (MIRENA, 52 MG,) 20 MCG/DAY IUD 1 each by Intrauterine route once. Placed in 07/2018     MAGNESIUM CITRATE PO Take 246 mg by mouth every evening. 3 gummies daily (Patient not taking: Reported on 04/04/2022)     MAGNESIUM PO Take 75 mg by mouth daily. Liquid     Multiple Minerals (MULTI-MINERALS PO) Take 30 mLs by mouth every evening. (Patient not taking: Reported on 04/18/2022)     Multiple Vitamins-Minerals (MULTIVITAMIN) LIQD Take 30  mLs by mouth every evening.     mupirocin cream (BACTROBAN) 2 % Apply 1 Application topically 2 (two) times daily. 15 g 0   Nutritional Supplements (ADRENAL COMPLEX PO) Take 1 mL by mouth in the morning and at bedtime. BIORAY Adrenal Lover     OVER THE COUNTER MEDICATION Take 1 mL by mouth daily. Lemon Balm for Stress, Can take up to 3 times daily if needed     Probiotic Product (DIGESTIVE ADVANTAGE KIDS) CHEW Chew 2 each by mouth See admin instructions. Take 2 with each meal, up  to 3 times daily     Probiotic Product (PROBIOTIC DAILY PO) Take 1 capsule by mouth daily. Garden of Life     psyllium (METAMUCIL) 58.6 % powder Take 1 packet by mouth daily.     triamcinolone (KENALOG) 0.025 % ointment Apply 1 Application topically 3 (three) times daily. 30 g 0   No current facility-administered medications for this visit.    Allergies as of 07/11/2022   (No Known Allergies)    Family History  Problem Relation Age of Onset   Hypertension Mother    Esophageal cancer Father    Hypertension Sister    Heart attack Other    Colon cancer Neg Hx    Inflammatory bowel disease Neg Hx     Social History   Socioeconomic History   Marital status: Married    Spouse name: Not on file   Number of children: Not on file   Years of education: Not on file   Highest education level: Not on file  Occupational History   Not on file  Tobacco Use   Smoking status: Never   Smokeless tobacco: Never  Vaping Use   Vaping Use: Never used  Substance and Sexual Activity   Alcohol use: No   Drug use: No   Sexual activity: Yes    Birth control/protection: None  Other Topics Concern   Not on file  Social History Narrative   Right handed   Wears prescription glasses    Not a caffeine drinker    Social Determinants of Health   Financial Resource Strain: Not on file  Food Insecurity: Not on file  Transportation Needs: Not on file  Physical Activity: Not on file  Stress: Not on file  Social Connections: Not on file    Review of Systems: Gen: Denies fever, chills, anorexia. Denies fatigue, weakness, weight loss.  CV: Denies chest pain, palpitations, syncope, peripheral edema, and claudication. Resp: Denies dyspnea at rest, cough, wheezing, coughing up blood, and pleurisy. GI: Denies vomiting blood, jaundice, and fecal incontinence.   Denies dysphagia or odynophagia. Derm: Denies rash, itching, dry skin Psych: Denies depression, anxiety, memory loss, confusion. No homicidal  or suicidal ideation.  Heme: Denies bruising, bleeding, and enlarged lymph nodes.  Physical Exam: There were no vitals taken for this visit. General:   Alert and oriented. No distress noted. Pleasant and cooperative.  Head:  Normocephalic and atraumatic. Eyes:  Conjuctiva clear without scleral icterus. Heart:  S1, S2 present without murmurs appreciated. Lungs:  Clear to auscultation bilaterally. No wheezes, rales, or rhonchi. No distress.  Abdomen:  +BS, soft, non-tender and non-distended. No rebound or guarding. No HSM or masses noted. Msk:  Symmetrical without gross deformities. Normal posture. Extremities:  Without edema. Neurologic:  Alert and  oriented x4 Psych:  Normal mood and affect.    Assessment:     Plan:  ***   Ermalinda Memos, PA-C [facility] 07/11/2022

## 2022-07-11 ENCOUNTER — Encounter: Payer: Self-pay | Admitting: Gastroenterology

## 2022-07-11 ENCOUNTER — Ambulatory Visit (INDEPENDENT_AMBULATORY_CARE_PROVIDER_SITE_OTHER): Payer: BC Managed Care – PPO | Admitting: Gastroenterology

## 2022-07-11 VITALS — BP 104/70 | HR 86 | Temp 97.7°F | Ht 62.0 in | Wt 111.6 lb

## 2022-07-11 DIAGNOSIS — K582 Mixed irritable bowel syndrome: Secondary | ICD-10-CM

## 2022-07-11 DIAGNOSIS — R12 Heartburn: Secondary | ICD-10-CM | POA: Insufficient documentation

## 2022-07-11 DIAGNOSIS — R1030 Lower abdominal pain, unspecified: Secondary | ICD-10-CM | POA: Diagnosis not present

## 2022-07-11 NOTE — Patient Instructions (Signed)
I am glad you are feeling much better!  Continue daily fiber supplement and probiotic as these are working well for you.  Use Pepcid as needed for heartburn.  If you have a flare of your abdominal pain that is persistent and not relieved by your typical measures including heating pad and backing your diet down, please let me know.  It was great to see you again today!  We will follow-up with you as needed.  Ermalinda Memos, PA-C [facility]

## 2022-07-13 DIAGNOSIS — E7210 Disorders of sulfur-bearing amino-acid metabolism, unspecified: Secondary | ICD-10-CM | POA: Diagnosis not present

## 2022-07-13 DIAGNOSIS — R5383 Other fatigue: Secondary | ICD-10-CM | POA: Diagnosis not present

## 2022-07-13 DIAGNOSIS — F419 Anxiety disorder, unspecified: Secondary | ICD-10-CM | POA: Diagnosis not present

## 2022-07-13 DIAGNOSIS — K219 Gastro-esophageal reflux disease without esophagitis: Secondary | ICD-10-CM | POA: Diagnosis not present

## 2022-07-13 DIAGNOSIS — K589 Irritable bowel syndrome without diarrhea: Secondary | ICD-10-CM | POA: Diagnosis not present

## 2022-07-17 DIAGNOSIS — Z6820 Body mass index (BMI) 20.0-20.9, adult: Secondary | ICD-10-CM | POA: Diagnosis not present

## 2022-07-17 DIAGNOSIS — Z124 Encounter for screening for malignant neoplasm of cervix: Secondary | ICD-10-CM | POA: Diagnosis not present

## 2022-07-17 DIAGNOSIS — Z01419 Encounter for gynecological examination (general) (routine) without abnormal findings: Secondary | ICD-10-CM | POA: Diagnosis not present

## 2022-07-27 DIAGNOSIS — F419 Anxiety disorder, unspecified: Secondary | ICD-10-CM | POA: Diagnosis not present

## 2022-07-27 DIAGNOSIS — E7210 Disorders of sulfur-bearing amino-acid metabolism, unspecified: Secondary | ICD-10-CM | POA: Diagnosis not present

## 2022-07-27 DIAGNOSIS — K589 Irritable bowel syndrome without diarrhea: Secondary | ICD-10-CM | POA: Diagnosis not present

## 2022-07-27 DIAGNOSIS — R5383 Other fatigue: Secondary | ICD-10-CM | POA: Diagnosis not present

## 2022-08-03 DIAGNOSIS — M5441 Lumbago with sciatica, right side: Secondary | ICD-10-CM | POA: Diagnosis not present

## 2022-08-03 DIAGNOSIS — M9903 Segmental and somatic dysfunction of lumbar region: Secondary | ICD-10-CM | POA: Diagnosis not present

## 2022-08-03 DIAGNOSIS — M5412 Radiculopathy, cervical region: Secondary | ICD-10-CM | POA: Diagnosis not present

## 2022-08-03 DIAGNOSIS — M9901 Segmental and somatic dysfunction of cervical region: Secondary | ICD-10-CM | POA: Diagnosis not present

## 2022-09-28 DIAGNOSIS — M9903 Segmental and somatic dysfunction of lumbar region: Secondary | ICD-10-CM | POA: Diagnosis not present

## 2022-09-28 DIAGNOSIS — M5441 Lumbago with sciatica, right side: Secondary | ICD-10-CM | POA: Diagnosis not present

## 2022-09-28 DIAGNOSIS — M5412 Radiculopathy, cervical region: Secondary | ICD-10-CM | POA: Diagnosis not present

## 2022-09-28 DIAGNOSIS — M9901 Segmental and somatic dysfunction of cervical region: Secondary | ICD-10-CM | POA: Diagnosis not present

## 2022-10-03 ENCOUNTER — Other Ambulatory Visit: Payer: Self-pay | Admitting: *Deleted

## 2022-10-03 ENCOUNTER — Encounter: Payer: Self-pay | Admitting: *Deleted

## 2022-10-03 DIAGNOSIS — F419 Anxiety disorder, unspecified: Secondary | ICD-10-CM | POA: Diagnosis not present

## 2022-10-03 DIAGNOSIS — K589 Irritable bowel syndrome without diarrhea: Secondary | ICD-10-CM | POA: Diagnosis not present

## 2022-10-03 DIAGNOSIS — R9389 Abnormal findings on diagnostic imaging of other specified body structures: Secondary | ICD-10-CM

## 2022-10-03 DIAGNOSIS — R1030 Lower abdominal pain, unspecified: Secondary | ICD-10-CM

## 2022-10-03 DIAGNOSIS — K219 Gastro-esophageal reflux disease without esophagitis: Secondary | ICD-10-CM | POA: Diagnosis not present

## 2022-10-03 DIAGNOSIS — E7210 Disorders of sulfur-bearing amino-acid metabolism, unspecified: Secondary | ICD-10-CM | POA: Diagnosis not present

## 2022-10-03 NOTE — Telephone Encounter (Signed)
Carelon PA: Order ID: [id number]       Completed  Approval Valid Through: 10/03/2022 - 11/01/2022

## 2022-10-03 NOTE — Telephone Encounter (Signed)
Mindy/Tammy: Anyway that we can get a stat CT A/P with contrast today for this patient to evaluate lower abdominal pain?  She has had multiple bouts of this previously but we have not gotten any imaging during a flare to see exactly what is going on. Need to rule out diverticulitis.

## 2022-10-04 ENCOUNTER — Ambulatory Visit (HOSPITAL_COMMUNITY)
Admission: RE | Admit: 2022-10-04 | Discharge: 2022-10-04 | Disposition: A | Discharge status: Home / Self Care | Payer: BC Managed Care – PPO | Source: Ambulatory Visit | Attending: Gastroenterology | Admitting: Gastroenterology

## 2022-10-04 ENCOUNTER — Encounter: Payer: Self-pay | Admitting: *Deleted

## 2022-10-04 DIAGNOSIS — R1030 Lower abdominal pain, unspecified: Secondary | ICD-10-CM | POA: Diagnosis not present

## 2022-10-04 MED ORDER — IOHEXOL 300 MG/ML  SOLN
100.0000 mL | Freq: Once | INTRAMUSCULAR | Status: AC | PRN
  Administered 2022-10-04: 100 mL via INTRAVENOUS

## 2022-10-04 MED ORDER — IOHEXOL 9 MG/ML PO SOLN
ORAL | Status: AC
  Filled 2022-10-04: qty 500

## 2022-10-06 NOTE — Telephone Encounter (Signed)
Mindy/Tammy:  Please see patients message below and refer to IR that is in her network.  Dx: CT suggestive of pelvic venous disease, lower abdominal pain/pelvic pain.

## 2022-10-19 DIAGNOSIS — K589 Irritable bowel syndrome without diarrhea: Secondary | ICD-10-CM | POA: Diagnosis not present

## 2022-10-19 DIAGNOSIS — F419 Anxiety disorder, unspecified: Secondary | ICD-10-CM | POA: Diagnosis not present

## 2022-10-19 DIAGNOSIS — E7210 Disorders of sulfur-bearing amino-acid metabolism, unspecified: Secondary | ICD-10-CM | POA: Diagnosis not present

## 2022-10-19 DIAGNOSIS — R5383 Other fatigue: Secondary | ICD-10-CM | POA: Diagnosis not present

## 2022-10-22 ENCOUNTER — Other Ambulatory Visit: Payer: Self-pay | Admitting: *Deleted

## 2022-10-22 DIAGNOSIS — R9389 Abnormal findings on diagnostic imaging of other specified body structures: Secondary | ICD-10-CM

## 2022-10-22 NOTE — Telephone Encounter (Signed)
Mindy/Tammy:  Please see patient's message. I am fine with referring to the Vein and Vascular specialist that she states is in network. Also ok to give her contact information for Dr. Elijio Miles with IR.

## 2022-11-08 DIAGNOSIS — D2239 Melanocytic nevi of other parts of face: Secondary | ICD-10-CM | POA: Diagnosis not present

## 2022-11-08 DIAGNOSIS — L821 Other seborrheic keratosis: Secondary | ICD-10-CM | POA: Diagnosis not present

## 2022-11-08 DIAGNOSIS — D2362 Other benign neoplasm of skin of left upper limb, including shoulder: Secondary | ICD-10-CM | POA: Diagnosis not present

## 2022-11-30 DIAGNOSIS — M5441 Lumbago with sciatica, right side: Secondary | ICD-10-CM | POA: Diagnosis not present

## 2022-11-30 DIAGNOSIS — M5412 Radiculopathy, cervical region: Secondary | ICD-10-CM | POA: Diagnosis not present

## 2022-11-30 DIAGNOSIS — M9901 Segmental and somatic dysfunction of cervical region: Secondary | ICD-10-CM | POA: Diagnosis not present

## 2022-11-30 DIAGNOSIS — M9903 Segmental and somatic dysfunction of lumbar region: Secondary | ICD-10-CM | POA: Diagnosis not present

## 2022-12-02 NOTE — H&P (View-Only) (Signed)
Vascular and Vein Specialist of [facility]  Patient name: Connie [name] MRN: [id number] DOB: October 07, 1990 Sex: female   REQUESTING PROVIDER:    Mosetta Pigeon   REASON FOR CONSULT:    Iliac vein compression  HISTORY OF PRESENT ILLNESS:   Connie [name] is a 32 y.o. 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  She has been having issues with abdominal pain.  She has a known history of diverticulosis as well as IBS.  She recently underwent a CT scan which showed iliac vein compression.  This brought up the possibility of pelvic congestive syndrome causing her symptoms and show she is referred for further evaluation.  The patient has a known liver lesion consistent with FNH.  She has had several episodes of diverticulitis.  She has had esophageal dilatation in 2023.  She also suffers from heartburn.  She does complain of left leg.  She has sciatica as well.  PAST MEDICAL HISTORY    Past Medical History:  Diagnosis Date   Diverticulitis 06/2019   Focal nodular hyperplasia of liver 2017   Stable imaging x4 years.    Hypoglycemic reaction    IBS (irritable bowel syndrome)      FAMILY HISTORY   Family History  Problem Relation Age of Onset   Hypertension Mother    Esophageal cancer Father    Hypertension Sister    Heart attack Other    Colon cancer Neg Hx    Inflammatory bowel disease Neg Hx     SOCIAL HISTORY:   Social History   Socioeconomic History   Marital status: Married    Spouse name: Not on file   Number of children: Not on file   Years of education: Not on file   Highest education level: Not on file  Occupational History   Not on file  Tobacco Use   Smoking status: Never   Smokeless tobacco: Never  Vaping Use   Vaping status: Never Used  Substance and Sexual Activity   Alcohol use: No   Drug use: No   Sexual activity: Yes    Birth control/protection: None  Other Topics Concern   Not on file   Social History Narrative   Right handed   Wears prescription glasses    Not a caffeine drinker    Social Determinants of Health   Financial Resource Strain: Not on file  Food Insecurity: Not on file  Transportation Needs: Not on file  Physical Activity: Not on file  Stress: Not on file  Social Connections: Not on file  Intimate Partner Violence: Not on file    ALLERGIES:    No Known Allergies  CURRENT MEDICATIONS:    Current Outpatient Medications  Medication Sig Dispense Refill   albuterol (VENTOLIN HFA) 108 (90 Base) MCG/ACT inhaler Inhale 1-2 puffs into the lungs every 6 (six) hours as needed for shortness of breath or wheezing. 8 g 0   Bacillus Coagulans-Inulin (PROBIOTIC FORMULA PO) Take 3 mLs by mouth every evening.     cetirizine (ZYRTEC) 10 MG tablet Take 10 mg by mouth daily as needed for allergies.     cholecalciferol (VITAMIN D) 25 MCG (1000 UNIT) tablet Take 1,000 Units by mouth daily.     COLLAGEN PO Take 10 g by mouth daily. 1 scoop     levonorgestrel (MIRENA, 52 MG,) 20 MCG/DAY IUD 1 each by Intrauterine route once. Placed in 07/2018     MAGNESIUM PO Take 75 mg by mouth daily. Liquid  mupirocin cream (BACTROBAN) 2 % Apply 1 Application topically 2 (two) times daily. 15 g 0   Nutritional Supplements (ADRENAL COMPLEX PO) Take 1 mL by mouth in the morning and at bedtime. BIORAY Adrenal Lover     OVER THE COUNTER MEDICATION Take 1 mL by mouth daily. Lemon Balm for Stress, Can take up to 3 times daily if needed     Probiotic Product (DIGESTIVE ADVANTAGE KIDS) CHEW Chew 2 each by mouth See admin instructions. Take 2 with each meal, up to 3 times daily     Probiotic Product (PROBIOTIC DAILY PO) Take 1 capsule by mouth daily. Garden of Life     psyllium (METAMUCIL) 58.6 % powder Take 1 packet by mouth daily.     triamcinolone (KENALOG) 0.025 % ointment Apply 1 Application topically 3 (three) times daily. 30 g 0   No current facility-administered medications for  this visit.    REVIEW OF SYSTEMS:   [X]  denotes positive finding, [ ]  denotes negative finding Cardiac  Comments:  Chest pain or chest pressure:    Shortness of breath upon exertion:    Short of breath when lying flat:    Irregular heart rhythm:        Vascular    Pain in calf, thigh, or hip brought on by ambulation:    Pain in feet at night that wakes you up from your sleep:     Blood clot in your veins:    Leg swelling:         Pulmonary    Oxygen at home:    Productive cough:     Wheezing:         Neurologic    Sudden weakness in arms or legs:     Sudden numbness in arms or legs:     Sudden onset of difficulty speaking or slurred speech:    Temporary loss of vision in one eye:     Problems with dizziness:         Gastrointestinal    Blood in stool:      Vomited blood:         Genitourinary    Burning when urinating:     Blood in urine:        Psychiatric    Major depression:         Hematologic    Bleeding problems:    Problems with blood clotting too easily:        Skin    Rashes or ulcers:        Constitutional    Fever or chills:     PHYSICAL EXAM:   Vitals:   12/03/22 0906  BP: 118/77  Pulse: (!) 114  Resp: 20  Temp: 98.9 F (37.2 C)  SpO2: 100%  Weight: 111 lb 9.6 oz (50.6 kg)  Height: 5\' 2"  (1.575 m)    GENERAL: The patient is a well-nourished female, in no acute distress. The vital signs are documented above. CARDIAC: There is a regular rate and rhythm.  VASCULAR: Palpable pedal pulses.  No significant edema PULMONARY: Nonlabored respirations ABDOMEN: Soft and non-tender with normal pitched bowel sounds.  MUSCULOSKELETAL: There are no major deformities or cyanosis. NEUROLOGIC: No focal weakness or paresthesias are detected. SKIN: There are no ulcers or rashes noted. PSYCHIATRIC: The patient has a normal affect.  STUDIES:   I have reviewed the following CT scan: 1. No acute findings in the abdomen or pelvis. 2. Of note, there is  a constellation of  findings suggestive of underlying pelvic venous disease, including a dilated left ovarian vein and prominent left iliolumbar vein as detailed. Pelvic venous disease can be a cause of chronic abdominopelvic pain in this patient population.    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  The patient was initially referred to radiology however her insurance did not have any approved physician within a 6-hour radius and so she was referred to vascular surgery.  I discussed her CT scan findings with her.  She does have left iliac vein compression from the right iliac artery.  There are several dilated pelvic veins that could be sequela I from this, raising the possibility of pelvic congestion syndrome causing some of her symptoms.  I discussed with her that I feel the next step in management is to get more information via a venogram.  This would be through a left femoral vein cannulation.  I would also plan on evaluating the stenosis/compression with IVUS.  If it appears significant I would proceed with angioplasty to see if this alleviates some of her symptoms.  I discussed that angioplasty would be limited and durability but it would at least get [place] some diagnostic information regarding whether or not this is the etiology of her symptoms.  We will work on getting this scheduled in the near future.   Charlena Cross, MD, FACS [facility]in Specialists of [facility] [phone] Pager [phone]

## 2022-12-02 NOTE — Progress Notes (Unsigned)
Vascular and Vein Specialist of [facility]  Patient name: Connie [name] MRN: [id number] DOB: February 17, 1991 Sex: female   REQUESTING PROVIDER:    Mosetta Pigeon   REASON FOR CONSULT:    Iliac vein compression  HISTORY OF PRESENT ILLNESS:   Emelda Fruchter is a 32 y.o. 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  She has been having issues with abdominal pain.  She has a known history of diverticulosis as well as IBS.  She recently underwent a CT scan which showed iliac vein compression.  This brought up the possibility of pelvic congestive syndrome causing her symptoms and show she is referred for further evaluation.  The patient has a known liver lesion consistent with FNH.  She has had several episodes of diverticulitis.  She has had esophageal dilatation in 2023.  She also suffers from heartburn.  She does complain of left leg.  She has sciatica as well.  PAST MEDICAL HISTORY    Past Medical History:  Diagnosis Date   Diverticulitis 06/2019   Focal nodular hyperplasia of liver 2017   Stable imaging x4 years.    Hypoglycemic reaction    IBS (irritable bowel syndrome)      FAMILY HISTORY   Family History  Problem Relation Age of Onset   Hypertension Mother    Esophageal cancer Father    Hypertension Sister    Heart attack Other    Colon cancer Neg Hx    Inflammatory bowel disease Neg Hx     SOCIAL HISTORY:   Social History   Socioeconomic History   Marital status: Married    Spouse name: Not on file   Number of children: Not on file   Years of education: Not on file   Highest education level: Not on file  Occupational History   Not on file  Tobacco Use   Smoking status: Never   Smokeless tobacco: Never  Vaping Use   Vaping status: Never Used  Substance and Sexual Activity   Alcohol use: No   Drug use: No   Sexual activity: Yes    Birth control/protection: None  Other Topics Concern   Not on file   Social History Narrative   Right handed   Wears prescription glasses    Not a caffeine drinker    Social Determinants of Health   Financial Resource Strain: Not on file  Food Insecurity: Not on file  Transportation Needs: Not on file  Physical Activity: Not on file  Stress: Not on file  Social Connections: Not on file  Intimate Partner Violence: Not on file    ALLERGIES:    No Known Allergies  CURRENT MEDICATIONS:    Current Outpatient Medications  Medication Sig Dispense Refill   albuterol (VENTOLIN HFA) 108 (90 Base) MCG/ACT inhaler Inhale 1-2 puffs into the lungs every 6 (six) hours as needed for shortness of breath or wheezing. 8 g 0   Bacillus Coagulans-Inulin (PROBIOTIC FORMULA PO) Take 3 mLs by mouth every evening.     cetirizine (ZYRTEC) 10 MG tablet Take 10 mg by mouth daily as needed for allergies.     cholecalciferol (VITAMIN D) 25 MCG (1000 UNIT) tablet Take 1,000 Units by mouth daily.     COLLAGEN PO Take 10 g by mouth daily. 1 scoop     levonorgestrel (MIRENA, 52 MG,) 20 MCG/DAY IUD 1 each by Intrauterine route once. Placed in 07/2018     MAGNESIUM PO Take 75 mg by mouth daily. Liquid  mupirocin cream (BACTROBAN) 2 % Apply 1 Application topically 2 (two) times daily. 15 g 0   Nutritional Supplements (ADRENAL COMPLEX PO) Take 1 mL by mouth in the morning and at bedtime. BIORAY Adrenal Lover     OVER THE COUNTER MEDICATION Take 1 mL by mouth daily. Lemon Balm for Stress, Can take up to 3 times daily if needed     Probiotic Product (DIGESTIVE ADVANTAGE KIDS) CHEW Chew 2 each by mouth See admin instructions. Take 2 with each meal, up to 3 times daily     Probiotic Product (PROBIOTIC DAILY PO) Take 1 capsule by mouth daily. Garden of Life     psyllium (METAMUCIL) 58.6 % powder Take 1 packet by mouth daily.     triamcinolone (KENALOG) 0.025 % ointment Apply 1 Application topically 3 (three) times daily. 30 g 0   No current facility-administered medications for  this visit.    REVIEW OF SYSTEMS:   [X]  denotes positive finding, [ ]  denotes negative finding Cardiac  Comments:  Chest pain or chest pressure:    Shortness of breath upon exertion:    Short of breath when lying flat:    Irregular heart rhythm:        Vascular    Pain in calf, thigh, or hip brought on by ambulation:    Pain in feet at night that wakes you up from your sleep:     Blood clot in your veins:    Leg swelling:         Pulmonary    Oxygen at home:    Productive cough:     Wheezing:         Neurologic    Sudden weakness in arms or legs:     Sudden numbness in arms or legs:     Sudden onset of difficulty speaking or slurred speech:    Temporary loss of vision in one eye:     Problems with dizziness:         Gastrointestinal    Blood in stool:      Vomited blood:         Genitourinary    Burning when urinating:     Blood in urine:        Psychiatric    Major depression:         Hematologic    Bleeding problems:    Problems with blood clotting too easily:        Skin    Rashes or ulcers:        Constitutional    Fever or chills:     PHYSICAL EXAM:   Vitals:   12/03/22 0906  BP: 118/77  Pulse: (!) 114  Resp: 20  Temp: 98.9 F (37.2 C)  SpO2: 100%  Weight: 111 lb 9.6 oz (50.6 kg)  Height: 5\' 2"  (1.575 m)    GENERAL: The patient is a well-nourished female, in no acute distress. The vital signs are documented above. CARDIAC: There is a regular rate and rhythm.  VASCULAR: Palpable pedal pulses.  No significant edema PULMONARY: Nonlabored respirations ABDOMEN: Soft and non-tender with normal pitched bowel sounds.  MUSCULOSKELETAL: There are no major deformities or cyanosis. NEUROLOGIC: No focal weakness or paresthesias are detected. SKIN: There are no ulcers or rashes noted. PSYCHIATRIC: The patient has a normal affect.  STUDIES:   I have reviewed the following CT scan: 1. No acute findings in the abdomen or pelvis. 2. Of note, there is  a constellation of  findings suggestive of underlying pelvic venous disease, including a dilated left ovarian vein and prominent left iliolumbar vein as detailed. Pelvic venous disease can be a cause of chronic abdominopelvic pain in this patient population.    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  The patient was initially referred to radiology however her insurance did not have any approved physician within a 6-hour radius and so she was referred to vascular surgery.  I discussed her CT scan findings with her.  She does have left iliac vein compression from the right iliac artery.  There are several dilated pelvic veins that could be sequela I from this, raising the possibility of pelvic congestion syndrome causing some of her symptoms.  I discussed with her that I feel the next step in management is to get more information via a venogram.  This would be through a left femoral vein cannulation.  I would also plan on evaluating the stenosis/compression with IVUS.  If it appears significant I would proceed with angioplasty to see if this alleviates some of her symptoms.  I discussed that angioplasty would be limited and durability but it would at least get [place] some diagnostic information regarding whether or not this is the etiology of her symptoms.  We will work on getting this scheduled in the near future.   Charlena Cross, MD, FACS Vascular and Vein Specialists of [facility] [phone] Pager [phone]

## 2022-12-03 ENCOUNTER — Encounter: Payer: Self-pay | Admitting: Surgery

## 2022-12-03 ENCOUNTER — Ambulatory Visit (INDEPENDENT_AMBULATORY_CARE_PROVIDER_SITE_OTHER): Payer: BC Managed Care – PPO | Admitting: Surgery

## 2022-12-03 VITALS — BP 118/77 | HR 114 | Temp 98.9°F | Resp 20 | Ht 62.0 in | Wt 111.6 lb

## 2022-12-03 DIAGNOSIS — N9489 Other specified conditions associated with female genital organs and menstrual cycle: Secondary | ICD-10-CM | POA: Diagnosis not present

## 2022-12-03 DIAGNOSIS — K580 Irritable bowel syndrome with diarrhea: Secondary | ICD-10-CM | POA: Diagnosis not present

## 2022-12-03 DIAGNOSIS — I871 Compression of vein: Secondary | ICD-10-CM

## 2022-12-07 DIAGNOSIS — K219 Gastro-esophageal reflux disease without esophagitis: Secondary | ICD-10-CM | POA: Diagnosis not present

## 2022-12-07 DIAGNOSIS — Z6820 Body mass index (BMI) 20.0-20.9, adult: Secondary | ICD-10-CM | POA: Diagnosis not present

## 2022-12-07 DIAGNOSIS — K588 Other irritable bowel syndrome: Secondary | ICD-10-CM | POA: Diagnosis not present

## 2022-12-07 DIAGNOSIS — Z0000 Encounter for general adult medical examination without abnormal findings: Secondary | ICD-10-CM | POA: Diagnosis not present

## 2022-12-07 DIAGNOSIS — K5730 Diverticulosis of large intestine without perforation or abscess without bleeding: Secondary | ICD-10-CM | POA: Diagnosis not present

## 2022-12-07 DIAGNOSIS — M353 Polymyalgia rheumatica: Secondary | ICD-10-CM | POA: Diagnosis not present

## 2022-12-07 DIAGNOSIS — Z0001 Encounter for general adult medical examination with abnormal findings: Secondary | ICD-10-CM | POA: Diagnosis not present

## 2022-12-07 DIAGNOSIS — Z1331 Encounter for screening for depression: Secondary | ICD-10-CM | POA: Diagnosis not present

## 2022-12-10 ENCOUNTER — Other Ambulatory Visit: Payer: Self-pay

## 2022-12-10 DIAGNOSIS — N9489 Other specified conditions associated with female genital organs and menstrual cycle: Secondary | ICD-10-CM

## 2022-12-18 ENCOUNTER — Ambulatory Visit (HOSPITAL_COMMUNITY)
Admission: RE | Admit: 2022-12-18 | Discharge: 2022-12-18 | Disposition: A | Discharge status: Home / Self Care | Payer: BC Managed Care – PPO | Attending: Surgery | Admitting: Surgery

## 2022-12-18 ENCOUNTER — Other Ambulatory Visit: Payer: Self-pay

## 2022-12-18 ENCOUNTER — Encounter (HOSPITAL_COMMUNITY)
Admission: RE | Disposition: A | Discharge status: Home / Self Care | Payer: Self-pay | Source: Home / Self Care | Attending: Surgery

## 2022-12-18 DIAGNOSIS — R109 Unspecified abdominal pain: Secondary | ICD-10-CM | POA: Insufficient documentation

## 2022-12-18 DIAGNOSIS — I871 Compression of vein: Secondary | ICD-10-CM

## 2022-12-18 DIAGNOSIS — M5432 Sciatica, left side: Secondary | ICD-10-CM | POA: Diagnosis not present

## 2022-12-18 DIAGNOSIS — K589 Irritable bowel syndrome without diarrhea: Secondary | ICD-10-CM | POA: Insufficient documentation

## 2022-12-18 DIAGNOSIS — N9489 Other specified conditions associated with female genital organs and menstrual cycle: Secondary | ICD-10-CM

## 2022-12-18 DIAGNOSIS — R933 Abnormal findings on diagnostic imaging of other parts of digestive tract: Secondary | ICD-10-CM

## 2022-12-18 HISTORY — PX: LOWER EXTREMITY VENOGRAPHY: CATH118253

## 2022-12-18 HISTORY — PX: PERIPHERAL VASCULAR INTERVENTION: CATH118257

## 2022-12-18 HISTORY — PX: PERIPHERAL VASCULAR ULTRASOUND/IVUS: CATH118334

## 2022-12-18 LAB — POCT I-STAT, CHEM 8
BUN: 12 mg/dL (ref 6–20)
Calcium, Ion: 1.27 mmol/L (ref 1.15–1.40)
Chloride: 102 mmol/L (ref 98–111)
Creatinine, Ser: 0.7 mg/dL (ref 0.44–1.00)
Glucose, Bld: 104 mg/dL — ABNORMAL HIGH (ref 70–99)
HCT: 40 % (ref 36.0–46.0)
Hemoglobin: 13.6 g/dL (ref 12.0–15.0)
Potassium: 3.3 mmol/L — ABNORMAL LOW (ref 3.5–5.1)
Sodium: 139 mmol/L (ref 135–145)
TCO2: 26 mmol/L (ref 22–32)

## 2022-12-18 LAB — PREGNANCY, URINE: Preg Test, Ur: NEGATIVE

## 2022-12-18 SURGERY — LOWER EXTREMITY VENOGRAPHY
Anesthesia: LOCAL

## 2022-12-18 MED ORDER — ZINC GLUCONATE 15 MG PO TABS: 15.0000 mg | ORAL_TABLET | Freq: Every day | ORAL | 0 refills | Status: DC

## 2022-12-18 MED ORDER — MIDAZOLAM HCL 2 MG/2ML IJ SOLN
INTRAMUSCULAR | Status: DC | PRN
  Administered 2022-12-18 (×2): 1 mg via INTRAVENOUS

## 2022-12-18 MED ORDER — SODIUM CHLORIDE 0.9 % IV SOLN: INTRAVENOUS | Status: DC

## 2022-12-18 MED ORDER — FENTANYL CITRATE (PF) 100 MCG/2ML IJ SOLN
INTRAMUSCULAR | Status: AC
  Filled 2022-12-18: qty 2

## 2022-12-18 MED ORDER — FENTANYL CITRATE (PF) 100 MCG/2ML IJ SOLN
INTRAMUSCULAR | Status: DC | PRN
  Administered 2022-12-18 (×2): 25 ug via INTRAVENOUS
  Administered 2022-12-18: 50 ug via INTRAVENOUS

## 2022-12-18 MED ORDER — IODIXANOL 320 MG/ML IV SOLN
INTRAVENOUS | Status: DC | PRN
  Administered 2022-12-18: 65 mL

## 2022-12-18 MED ORDER — HEPARIN (PORCINE) IN NACL 2000-0.9 UNIT/L-% IV SOLN
INTRAVENOUS | Status: DC | PRN
  Administered 2022-12-18: 1000 mL

## 2022-12-18 MED ORDER — LIDOCAINE HCL (PF) 1 % IJ SOLN
INTRAMUSCULAR | Status: AC
  Filled 2022-12-18: qty 30

## 2022-12-18 MED ORDER — LIDOCAINE HCL (PF) 1 % IJ SOLN
INTRAMUSCULAR | Status: DC | PRN
  Administered 2022-12-18: 12 mL

## 2022-12-18 MED ORDER — MIDAZOLAM HCL 2 MG/2ML IJ SOLN
INTRAMUSCULAR | Status: AC
  Filled 2022-12-18: qty 2

## 2022-12-18 SURGICAL SUPPLY — 21 items
BALLN MUSTANG 10.0X40 75 (BALLOONS) ×2
BALLN MUSTANG 12.0X40 75 (BALLOONS) ×2
BALLOON MUSTANG 10.0X40 75 (BALLOONS) IMPLANT
BALLOON MUSTANG 12.0X40 75 (BALLOONS) IMPLANT
CATH ANGIO 5F BER 100CM (CATHETERS) IMPLANT
CATH BEACON 5 .035 100 JB1 TIP (CATHETERS) IMPLANT
CATH CROSS OVER TEMPO 5F (CATHETERS) IMPLANT
CATH NAVICROSS ST 65CM (CATHETERS) IMPLANT
CATH VISIONS PV .035 IVUS (CATHETERS) IMPLANT
CATHETER NAVICROSS ST 65CM (CATHETERS) ×2
COVER DOME SNAP 22 D (MISCELLANEOUS) IMPLANT
GLIDEWIRE ADV .035X260CM (WIRE) IMPLANT
KIT ENCORE 26 ADVANTAGE (KITS) IMPLANT
KIT MICROPUNCTURE NIT STIFF (SHEATH) IMPLANT
KIT PV (KITS) ×2 IMPLANT
SHEATH PINNACLE 8F 10CM (SHEATH) IMPLANT
SHEATH PROBE COVER 6X72 (BAG) IMPLANT
TRANSDUCER W/STOPCOCK (MISCELLANEOUS) ×2 IMPLANT
TRAY PV CATH (CUSTOM PROCEDURE TRAY) ×2 IMPLANT
TUBING ART PRESS 72 MALE/FEM (TUBING) IMPLANT
WIRE BENTSON .035X145CM (WIRE) IMPLANT

## 2022-12-18 NOTE — Interval H&P Note (Signed)
History and Physical Interval Note:  12/18/2022 7:50 AM  Connie [name]  has presented today for surgery, with the diagnosis of pelvic congestion.  The various methods of treatment have been discussed with the patient and family. After consideration of risks, benefits and other options for treatment, the patient has consented to  Procedure(s): LOWER EXTREMITY VENOGRAPHY (N/A) as a surgical intervention.  The patient's history has been reviewed, patient examined, no change in status, stable for surgery.  I have reviewed the patient's chart and labs.  Questions were answered to the patient's satisfaction.     [name]

## 2022-12-18 NOTE — Op Note (Signed)
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  Ultrasound-guided access, left femoral vein  2.  Ileo-caval venogram  3.  Selective injection with catheter in left renal vein  4.  Selective injection with catheter in right renal vein  5.  Selective injection with catheter in left ovarian vein  6.  Selective injection with catheter in left lumbar vein  7.  Selective injection with catheter in right femoral vein  8.  Intravascular ultrasound of the left external iliac, common iliac vein and intra vena cava  9.  Balloon venoplasty, left common iliac vein  10.  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  She comes in today for further diagnostic imaging  Procedure:  The patient was identified in the holding area and taken to room 8.  The patient was then placed supine on the table and prepped and draped in the usual sterile fashion.  A time out was called.  Conscious sedation was administered with the use of IV fentanyl and Versed under continuous physician and nurse monitoring.  Heart rate, blood pressure, and oxygen saturation were continuously monitored.  Total sedation time was 76 minutes.  Ultrasound was used to evaluate the left common femoral vein.  It was widely patent and easily compressible.  It was cannulated under ultrasound guidance with a micropuncture needle.  A 018 wire was inserted without resistance followed by placement of a micropuncture sheath.  Next a Bentson wire was inserted and an 8 [place] sheath was placed.  A contrast injection was performed through the sheath for ilio caval venography.  Next, using a JB 1 catheter the left renal vein was selected and a left renal vein  angiogram was performed.  I then selected the left ovarian vein with the JB 1 catheter and took multiple selective images within the left ovarian vein.  Next, from the left groin the left lumbar vein was cannulated and venography was performed.  I then crossed over the bifurcation and placed the cath in the right femoral vein and performed venography from this location.  IVUS was then used to evaluate for possible May Thurner syndrome.  There was approximately a 75% narrowing in the vein posterior to the iliac artery.  I treated this with a 10 mm balloon, followed by a 12 mm balloon.   Findings:   IVCgram: No anatomic anomalies.  The left lumbar vein is visualized coming off the external iliac vein.  No prominent collaterals are visualized.  Left renal vein: No pressure gradient was obtained from the renal vein at the hilum back into the inferior vena cava.  The pressure was 9 mm large ovarian vein is visualized with multiple collaterals  Left ovarian vein: Significant dilatation of the ovarian vein which opacifies pelvic veins.  No pressure gradient was noted.  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  This was treated with a 12 mm balloon.  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  This is the suspected etiology of her issues.  We will have formal discussions regarding next steps in management, likely coil embolization  V. [name], M.D., [facility] [place] Office: [phone] Pager:  [phone]

## 2022-12-19 ENCOUNTER — Encounter (HOSPITAL_COMMUNITY): Payer: Self-pay | Admitting: Surgery

## 2022-12-21 ENCOUNTER — Encounter: Payer: Self-pay | Admitting: Surgery

## 2023-02-08 DIAGNOSIS — Z1329 Encounter for screening for other suspected endocrine disorder: Secondary | ICD-10-CM | POA: Diagnosis not present

## 2023-02-08 DIAGNOSIS — N644 Mastodynia: Secondary | ICD-10-CM | POA: Diagnosis not present

## 2023-02-12 ENCOUNTER — Other Ambulatory Visit: Payer: Self-pay | Admitting: *Deleted

## 2023-02-12 DIAGNOSIS — N9489 Other specified conditions associated with female genital organs and menstrual cycle: Secondary | ICD-10-CM

## 2023-02-12 DIAGNOSIS — I871 Compression of vein: Secondary | ICD-10-CM

## 2023-02-18 ENCOUNTER — Ambulatory Visit (HOSPITAL_COMMUNITY)
Admission: RE | Admit: 2023-02-18 | Discharge: 2023-02-18 | Disposition: A | Discharge status: Home / Self Care | Payer: BC Managed Care – PPO | Source: Ambulatory Visit | Attending: Surgery | Admitting: Surgery

## 2023-02-18 ENCOUNTER — Encounter: Payer: Self-pay | Admitting: Surgery

## 2023-02-18 ENCOUNTER — Ambulatory Visit (INDEPENDENT_AMBULATORY_CARE_PROVIDER_SITE_OTHER): Payer: BC Managed Care – PPO | Admitting: Surgery

## 2023-02-18 VITALS — BP 102/70 | HR 66 | Temp 98.2°F | Resp 20 | Ht 62.0 in | Wt 113.0 lb

## 2023-02-18 DIAGNOSIS — N9489 Other specified conditions associated with female genital organs and menstrual cycle: Secondary | ICD-10-CM

## 2023-02-18 DIAGNOSIS — I871 Compression of vein: Secondary | ICD-10-CM | POA: Insufficient documentation

## 2023-02-18 NOTE — Progress Notes (Signed)
[facility]  Patient name: Connie [name] MRN: [id number] DOB: 08-27-1990 Sex: female   REASON FOR VISIT:    Follow up  HISOTRY OF PRESENT ILLNESS:    Shaolin Dragotta is a 32 y.o. female who I initially saw for pelvic congestion syndrome.  She was having issues with abdominal pain.  She has a history of diverticulosis as well as IBS.  She had a CT scan that showed iliac vein compression.  She went for venography on 12/18/2022.  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  She was also found to have a dilated left ovarian vein that refluxed into the pelvic veins.  She is back today for follow-up.  She states that her pelvic pain has significantly improved.  She does have some burning and tenderness on her anterior thigh.  Overall she feels she is much better   The patient has a known liver lesion consistent with FNH.  She has had several episodes of diverticulitis.  She has had esophageal dilatation in 2023.  She also suffers from heartburn.  She does complain of left leg.  She has sciatica as well.  PAST MEDICAL HISTORY:   Past Medical History:  Diagnosis Date   Diverticulitis 06/2019   Focal nodular hyperplasia of liver 2017   Stable imaging x4 years.    Hypoglycemic reaction    IBS (irritable bowel syndrome)      FAMILY HISTORY:   Family History  Problem Relation Age of Onset   Hypertension Mother    Esophageal cancer Father    Hypertension Sister    Heart attack Other    Colon cancer Neg Hx    Inflammatory bowel disease Neg Hx     SOCIAL HISTORY:   Social History   Tobacco Use   Smoking status: Never   Smokeless tobacco: Never  Substance Use Topics   Alcohol use: No     ALLERGIES:   Allergies  Allergen Reactions   Other Diarrhea and Other (See Comments)    SESAME SEEDS-abdominal cramping     CURRENT MEDICATIONS:    Current Outpatient Medications  Medication Sig Dispense Refill   albuterol (VENTOLIN HFA) 108 (90 Base) MCG/ACT inhaler Inhale 1-2 puffs into the lungs every 6 (six) hours as needed for shortness of breath or wheezing. 8 g 0   cetirizine (ZYRTEC) 10 MG tablet Take 10 mg by mouth daily as needed for allergies.     Cholecalciferol (VITAMIN D3) 50 MCG (2000 UT) TABS Take 2,000 Units by mouth 2 (two) times daily.     fluconazole (DIFLUCAN) 100 MG tablet Take 100 mg by mouth 2 (two) times daily.     levonorgestrel (MIRENA, 52 MG,) 20 MCG/DAY IUD 1 each by Intrauterine route once. Placed in 07/2018     MAGNESIUM PO Take 4 tablets by mouth at bedtime. Calm gummy 83 mg each     mupirocin cream (BACTROBAN) 2 % Apply 1 Application topically 2 (two) times daily. 15 g 0   NALTREXONE HCL PO Take 3 mg by mouth daily.     Nutritional Supplements (ADRENAL COMPLEX PO) Take 1 capsule by mouth daily. Thorne Adrenal cortex 50 mg each     OVER THE COUNTER MEDICATION Take 2 capsules by mouth 2 (two) times daily. Circulation synergy     OVER THE COUNTER MEDICATION Take 15 mLs by mouth at bedtime. argentyn 23  OVER THE COUNTER MEDICATION Take 5 mg by mouth at bedtime. silver biotics immune support     OVER THE COUNTER MEDICATION Take 5 drops by mouth 2 (two) times daily. Biocidin advanced formula     OVER THE COUNTER MEDICATION Take 0.5 tablets by mouth at bedtime. Methylation complete     OVER THE COUNTER MEDICATION Take 2 tablets by mouth at bedtime. Microbiome labs Zenbiome duo     OVER THE COUNTER MEDICATION Place 1 spray into both nostrils as needed (immunity). argentyn 23 nasal spray     OVER THE COUNTER MEDICATION Take 1-3 mLs by mouth daily as needed (Stress). Lemon Balm     Probiotic Product (DIGESTIVE ADVANTAGE KIDS) CHEW Chew 2 each by mouth See admin instructions. Take 2 with each meal, up to 3 times daily     Probiotic Product (PROBIOTIC DAILY PO) Take 1 capsule by mouth daily. Woman     psyllium  (METAMUCIL) 58.6 % powder Take 1 packet by mouth daily.     triamcinolone (KENALOG) 0.025 % ointment Apply 1 Application topically 3 (three) times daily. 30 g 0   Zinc Gluconate 15 MG TABS Take 1 tablet (15 mg total) by mouth daily. 30 tablet 0   No current facility-administered medications for this visit.    REVIEW OF SYSTEMS:   [X]  denotes positive finding, [ ]  denotes negative finding Cardiac  Comments:  Chest pain or chest pressure:    Shortness of breath upon exertion:    Short of breath when lying flat:    Irregular heart rhythm:        Vascular    Pain in calf, thigh, or hip brought on by ambulation:    Pain in feet at night that wakes you up from your sleep:     Blood clot in your veins:    Leg swelling:         Pulmonary    Oxygen at home:    Productive cough:     Wheezing:         Neurologic    Sudden weakness in arms or legs:     Sudden numbness in arms or legs:     Sudden onset of difficulty speaking or slurred speech:    Temporary loss of vision in one eye:     Problems with dizziness:         Gastrointestinal    Blood in stool:     Vomited blood:         Genitourinary    Burning when urinating:     Blood in urine:        Psychiatric    Major depression:         Hematologic    Bleeding problems:    Problems with blood clotting too easily:        Skin    Rashes or ulcers:        Constitutional    Fever or chills:      PHYSICAL EXAM:   Vitals:   02/18/23 0927  BP: 102/70  Pulse: 66  Resp: 20  Temp: 98.2 F (36.8 C)  SpO2: 96%  Weight: 113 lb (51.3 kg)  Height: 5\' 2"  (1.575 m)    GENERAL: The patient is a well-nourished female, in no acute distress. The vital signs are documented above. CARDIAC: There is a regular rate and rhythm.  VASCULAR: No significant leg swelling PULMONARY: Non-labored respirations MUSCULOSKELETAL: There are no major deformities or cyanosis. NEUROLOGIC: No focal weakness  or paresthesias are detected. SKIN:  There are no ulcers or rashes noted. PSYCHIATRIC: The patient has a normal affect.  STUDIES:   I have reviewed the following venous ultrasound: IVC/Iliac: Patent IVC. and left CIV and EIV.      MEDICAL ISSUES:   Pelvic venous congestion: The patient had a stenosis in her left common iliac vein which was treated with primary angioplasty.  She has had good symptomatic relief.  I would like to see how durable this is.  Other treatment options include embolization of her ovarian vein and/or her lumbar vein which do show significant reflux and could also be contributing to her symptoms.  Fortunately, she has had good relief with her most recent intervention.  Hopefully this will turn out to be very durable.  I will have her return in 1 year for follow-up imaging    [name], IV, MD, FACS [facility] [phone] Pager [phone]

## 2023-03-05 ENCOUNTER — Other Ambulatory Visit: Payer: Self-pay

## 2023-03-05 DIAGNOSIS — I871 Compression of vein: Secondary | ICD-10-CM

## 2023-03-06 DIAGNOSIS — N6002 Solitary cyst of left breast: Secondary | ICD-10-CM | POA: Diagnosis not present

## 2023-03-12 ENCOUNTER — Inpatient Hospital Stay
Admission: RE | Admit: 2023-03-12 | Discharge: 2023-03-12 | Disposition: A | Discharge status: Home / Self Care | Payer: Self-pay | Source: Ambulatory Visit | Attending: Obstetrics and Gynecology | Admitting: Obstetrics and Gynecology

## 2023-03-12 ENCOUNTER — Encounter (HOSPITAL_COMMUNITY): Payer: Self-pay | Admitting: Obstetrics and Gynecology

## 2023-03-12 ENCOUNTER — Other Ambulatory Visit (HOSPITAL_COMMUNITY): Payer: Self-pay | Admitting: Obstetrics and Gynecology

## 2023-03-12 DIAGNOSIS — N6002 Solitary cyst of left breast: Secondary | ICD-10-CM

## 2023-03-26 ENCOUNTER — Ambulatory Visit (HOSPITAL_COMMUNITY)
Admission: RE | Admit: 2023-03-26 | Discharge: 2023-03-26 | Disposition: A | Discharge status: Home / Self Care | Payer: BC Managed Care – PPO | Source: Ambulatory Visit | Attending: Obstetrics and Gynecology | Admitting: Obstetrics and Gynecology

## 2023-03-26 ENCOUNTER — Encounter (HOSPITAL_COMMUNITY): Payer: Self-pay

## 2023-03-26 DIAGNOSIS — N644 Mastodynia: Secondary | ICD-10-CM | POA: Insufficient documentation

## 2023-03-26 DIAGNOSIS — N6002 Solitary cyst of left breast: Secondary | ICD-10-CM | POA: Insufficient documentation

## 2023-03-26 MED ORDER — LIDOCAINE-EPINEPHRINE (PF) 1 %-1:200000 IJ SOLN
10.0000 mL | Freq: Once | INTRAMUSCULAR | Status: AC
  Administered 2023-03-26: 10 mL via INTRADERMAL

## 2023-03-26 MED ORDER — LIDOCAINE HCL (PF) 2 % IJ SOLN
INTRAMUSCULAR | Status: AC
  Filled 2023-03-26: qty 10

## 2023-03-26 MED ORDER — LIDOCAINE HCL (PF) 2 % IJ SOLN
10.0000 mL | Freq: Once | INTRAMUSCULAR | Status: AC
  Administered 2023-03-26: 10 mL

## 2023-03-26 NOTE — Progress Notes (Signed)
Patient tolerated left breast aspiration procedure well today. Patient verbalized understanding of discharge instructions and ambulatory at departure with no acute distress noted.

## 2023-03-28 DIAGNOSIS — E7210 Disorders of sulfur-bearing amino-acid metabolism, unspecified: Secondary | ICD-10-CM | POA: Diagnosis not present

## 2023-03-28 DIAGNOSIS — F419 Anxiety disorder, unspecified: Secondary | ICD-10-CM | POA: Diagnosis not present

## 2023-03-28 DIAGNOSIS — K219 Gastro-esophageal reflux disease without esophagitis: Secondary | ICD-10-CM | POA: Diagnosis not present

## 2023-03-28 DIAGNOSIS — K589 Irritable bowel syndrome without diarrhea: Secondary | ICD-10-CM | POA: Diagnosis not present

## 2023-03-29 DIAGNOSIS — M9901 Segmental and somatic dysfunction of cervical region: Secondary | ICD-10-CM | POA: Diagnosis not present

## 2023-03-29 DIAGNOSIS — M9903 Segmental and somatic dysfunction of lumbar region: Secondary | ICD-10-CM | POA: Diagnosis not present

## 2023-03-29 DIAGNOSIS — M5412 Radiculopathy, cervical region: Secondary | ICD-10-CM | POA: Diagnosis not present

## 2023-03-29 DIAGNOSIS — M5441 Lumbago with sciatica, right side: Secondary | ICD-10-CM | POA: Diagnosis not present

## 2023-04-12 DIAGNOSIS — F419 Anxiety disorder, unspecified: Secondary | ICD-10-CM | POA: Diagnosis not present

## 2023-04-12 DIAGNOSIS — R5383 Other fatigue: Secondary | ICD-10-CM | POA: Diagnosis not present

## 2023-04-12 DIAGNOSIS — E7210 Disorders of sulfur-bearing amino-acid metabolism, unspecified: Secondary | ICD-10-CM | POA: Diagnosis not present

## 2023-04-12 DIAGNOSIS — K589 Irritable bowel syndrome without diarrhea: Secondary | ICD-10-CM | POA: Diagnosis not present

## 2023-04-26 DIAGNOSIS — M5441 Lumbago with sciatica, right side: Secondary | ICD-10-CM | POA: Diagnosis not present

## 2023-04-26 DIAGNOSIS — M9903 Segmental and somatic dysfunction of lumbar region: Secondary | ICD-10-CM | POA: Diagnosis not present

## 2023-04-26 DIAGNOSIS — M9901 Segmental and somatic dysfunction of cervical region: Secondary | ICD-10-CM | POA: Diagnosis not present

## 2023-04-26 DIAGNOSIS — M5412 Radiculopathy, cervical region: Secondary | ICD-10-CM | POA: Diagnosis not present

## 2023-05-31 DIAGNOSIS — M9901 Segmental and somatic dysfunction of cervical region: Secondary | ICD-10-CM | POA: Diagnosis not present

## 2023-05-31 DIAGNOSIS — M9903 Segmental and somatic dysfunction of lumbar region: Secondary | ICD-10-CM | POA: Diagnosis not present

## 2023-05-31 DIAGNOSIS — M5441 Lumbago with sciatica, right side: Secondary | ICD-10-CM | POA: Diagnosis not present

## 2023-05-31 DIAGNOSIS — M5412 Radiculopathy, cervical region: Secondary | ICD-10-CM | POA: Diagnosis not present

## 2023-06-20 DIAGNOSIS — R5383 Other fatigue: Secondary | ICD-10-CM | POA: Diagnosis not present

## 2023-06-20 DIAGNOSIS — K219 Gastro-esophageal reflux disease without esophagitis: Secondary | ICD-10-CM | POA: Diagnosis not present

## 2023-06-20 DIAGNOSIS — F419 Anxiety disorder, unspecified: Secondary | ICD-10-CM | POA: Diagnosis not present

## 2023-06-20 DIAGNOSIS — E7210 Disorders of sulfur-bearing amino-acid metabolism, unspecified: Secondary | ICD-10-CM | POA: Diagnosis not present

## 2023-06-20 DIAGNOSIS — K589 Irritable bowel syndrome without diarrhea: Secondary | ICD-10-CM | POA: Diagnosis not present

## 2023-06-28 DIAGNOSIS — M5441 Lumbago with sciatica, right side: Secondary | ICD-10-CM | POA: Diagnosis not present

## 2023-06-28 DIAGNOSIS — M9901 Segmental and somatic dysfunction of cervical region: Secondary | ICD-10-CM | POA: Diagnosis not present

## 2023-06-28 DIAGNOSIS — M5412 Radiculopathy, cervical region: Secondary | ICD-10-CM | POA: Diagnosis not present

## 2023-06-28 DIAGNOSIS — M9903 Segmental and somatic dysfunction of lumbar region: Secondary | ICD-10-CM | POA: Diagnosis not present

## 2023-07-12 DIAGNOSIS — E7210 Disorders of sulfur-bearing amino-acid metabolism, unspecified: Secondary | ICD-10-CM | POA: Diagnosis not present

## 2023-07-12 DIAGNOSIS — R5383 Other fatigue: Secondary | ICD-10-CM | POA: Diagnosis not present

## 2023-07-12 DIAGNOSIS — K589 Irritable bowel syndrome without diarrhea: Secondary | ICD-10-CM | POA: Diagnosis not present

## 2023-07-12 DIAGNOSIS — F419 Anxiety disorder, unspecified: Secondary | ICD-10-CM | POA: Diagnosis not present

## 2023-08-20 ENCOUNTER — Encounter: Payer: Self-pay | Admitting: Surgery

## 2023-08-21 ENCOUNTER — Telehealth: Payer: Self-pay

## 2023-08-21 NOTE — Telephone Encounter (Signed)
 Patient left message on her MyChart asking about using compression stockings because she is experiencing pain in left thigh again. Patient reported she is increasing her activity level and doing yoga to open her pelvic area and it seems to be helping with the burning.  She plans to continue the yoga. Advised that she could try the compression stocking again but wasn't sure if it would help.  She reported she may try them again and would discontinue if there is no change.  Patient advised to call back if the pain increases or if her symptoms she has before her procedure returned.

## 2023-08-23 ENCOUNTER — Ambulatory Visit: Admitting: Student in an Organized Health Care Education/Training Program

## 2023-08-23 DIAGNOSIS — M5441 Lumbago with sciatica, right side: Secondary | ICD-10-CM | POA: Diagnosis not present

## 2023-08-23 DIAGNOSIS — M9903 Segmental and somatic dysfunction of lumbar region: Secondary | ICD-10-CM | POA: Diagnosis not present

## 2023-08-23 DIAGNOSIS — M5412 Radiculopathy, cervical region: Secondary | ICD-10-CM | POA: Diagnosis not present

## 2023-08-23 DIAGNOSIS — M9901 Segmental and somatic dysfunction of cervical region: Secondary | ICD-10-CM | POA: Diagnosis not present

## 2023-09-06 ENCOUNTER — Ambulatory Visit (INDEPENDENT_AMBULATORY_CARE_PROVIDER_SITE_OTHER): Admitting: Student in an Organized Health Care Education/Training Program

## 2023-09-06 VITALS — BP 103/6 | HR 79 | Temp 97.7°F | Ht 62.0 in | Wt 117.2 lb

## 2023-09-06 DIAGNOSIS — N9489 Other specified conditions associated with female genital organs and menstrual cycle: Secondary | ICD-10-CM | POA: Diagnosis not present

## 2023-09-06 DIAGNOSIS — Z3040 Encounter for surveillance of contraceptives, unspecified: Secondary | ICD-10-CM

## 2023-09-06 DIAGNOSIS — I871 Compression of vein: Secondary | ICD-10-CM | POA: Diagnosis not present

## 2023-09-06 DIAGNOSIS — Z77120 Contact with and (suspected) exposure to mold (toxic): Secondary | ICD-10-CM | POA: Diagnosis not present

## 2023-09-06 DIAGNOSIS — Z0000 Encounter for general adult medical examination without abnormal findings: Secondary | ICD-10-CM | POA: Diagnosis not present

## 2023-09-06 DIAGNOSIS — K5790 Diverticulosis of intestine, part unspecified, without perforation or abscess without bleeding: Secondary | ICD-10-CM | POA: Insufficient documentation

## 2023-09-06 NOTE — Assessment & Plan Note (Signed)
 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 Undergoing itraconazole treatment with liver function monitoring at [facility]. - itraconazole with monthly liver function tests.  No other medication interactions that I see.

## 2023-09-06 NOTE — Assessment & Plan Note (Signed)
 Engages in regular exercise, healthy diet, and abstains from tobacco, alcohol, or substances. Mental health supported through therapy and spiritual practices. - Continue regular exercise and healthy diet. - Maintain mental health support through therapy and spiritual practices.

## 2023-09-06 NOTE — Assessment & Plan Note (Signed)
 Using IUD, planning removal. Advised against further pregnancies. Husband plans vasectomy. - Discuss temporary oral contraception options with gynecologist post-IUD removal.

## 2023-09-06 NOTE — Progress Notes (Signed)
 Complete physical exam  Patient: Connie [name]   DOB: 20-Nov-1990   33 y.o. Female  MRN: [id number]  Subjective:    Chief Complaint  Patient presents with   Establish Care    Patient here to est care. Patient mentions having a cold a couple weeks back and believes she had an ear infection and wants it looked at     Connie [name] is a 33 y.o. female who presents today for a complete physical exam. She reports consuming a general diet. Home exercise routine includes yoga. She generally feels well. She reports sleeping well. She does not have additional problems to discuss today.   Discussed the use of AI scribe software for clinical note transcription with the patient, who gave verbal consent to proceed.  History of Present Illness Connie [name] is a 33 year old female who presents for an annual physical exam.  She experienced symptoms consistent with an ear infection a few weeks ago, including off-balance sensations, pressure, and increased jaw clicking, primarily affecting the right ear. These symptoms lasted three to four weeks and have resolved for about a week and a half. No current ear pain.  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 She underwent a vascular procedure in October. Initially, she experienced increased pain post-procedure, but by the fifth month, her symptoms improved significantly. Recently, she began experiencing pain in her left thigh again, with intermittent purple discoloration of her toes and a dull ache in her left calf. The pain occurs a few days a week and is sometimes accompanied by cramping. She finds relief by elevating her legs.  She has a history of diverticulosis with two confirmed episodes of diverticulitis. She was previously prescribed a standing order of Cipro and metronidazole for suspected flares, which she used during travel or when symptoms arose. She initially thought she had six or seven flares, but it was later suggested that some pain might  have been due to vascular congestion. No current symptoms of diverticulitis.  She has been dealing with anxiety and gastrointestinal issues, which led her to seek care from an integrative doctor. She was diagnosed with yeast overgrowth, parasites, and mold toxicity. She is currently on low dose naltrexone (3 mg) for nerve issues and itraconazole for mold toxicity. She previously used fluconazole for yeast and metronidazole for parasites. Her integrative treatment includes various supplements, and she undergoes regular liver function tests due to itraconazole use.  She lives with her husband and five-year-old son in [place], works in office administration, and is planning to transition to a standing desk to manage her symptoms better. She uses an IUD for contraception but plans for her husband to undergo a vasectomy. She manages her anxiety with supplements and therapy, engages in yoga and walking for exercise, and maintains a largely unprocessed diet, avoiding fast food.    Most recent fall risk assessment:    09/06/2023    8:13 AM  Fall Risk   Falls in the past year? 0  Number falls in past yr: 0  Injury with Fall? 0  Risk for fall due to : No Fall Risks  Follow up Falls evaluation completed     Most recent depression screenings:    09/06/2023    8:13 AM  PHQ 2/9 Scores  PHQ - 2 Score 0     Patient Care Team: Marvine Rush, MD as PCP - General (Family Medicine) Shaaron, Lamar HERO, MD as Consulting Physician (Gastroenterology) Livingston Rigg, MD (Inactive) as Consulting Physician (Dermatology)  Outpatient Medications Prior to Visit  Medication Sig   albuterol  (VENTOLIN  HFA) 108 (90 Base) MCG/ACT inhaler Inhale 1-2 puffs into the lungs every 6 (six) hours as needed for shortness of breath or wheezing.   itraconazole (SPORANOX) 100 MG capsule Take 100 mg by mouth daily.   levonorgestrel  (MIRENA , 52 MG,) 20 MCG/DAY IUD 1 each by Intrauterine route once. Placed in 07/2018    NALTREXONE HCL PO Take 3 mg by mouth daily.   [DISCONTINUED] cetirizine (ZYRTEC) 10 MG tablet Take 10 mg by mouth daily as needed for allergies.   [DISCONTINUED] Cholecalciferol (VITAMIN D3) 50 MCG (2000 UT) TABS Take 2,000 Units by mouth 2 (two) times daily.   [DISCONTINUED] MAGNESIUM PO Take 4 tablets by mouth at bedtime. Calm gummy 83 mg each   [DISCONTINUED] Nutritional Supplements (ADRENAL COMPLEX PO) Take 1 capsule by mouth daily. Thorne Adrenal cortex 50 mg each   [DISCONTINUED] OVER THE COUNTER MEDICATION Take 2 capsules by mouth 2 (two) times daily. Circulation synergy   [DISCONTINUED] OVER THE COUNTER MEDICATION Take 15 mLs by mouth at bedtime. argentyn 23   [DISCONTINUED] OVER THE COUNTER MEDICATION Take 5 mg by mouth at bedtime. silver biotics immune support   [DISCONTINUED] OVER THE COUNTER MEDICATION Take 5 drops by mouth 2 (two) times daily. Biocidin advanced formula   [DISCONTINUED] OVER THE COUNTER MEDICATION Take 0.5 tablets by mouth at bedtime. Methylation complete   [DISCONTINUED] OVER THE COUNTER MEDICATION Take 2 tablets by mouth at bedtime. Microbiome labs Zenbiome duo   [DISCONTINUED] OVER THE COUNTER MEDICATION Place 1 spray into both nostrils as needed (immunity). argentyn 23 nasal spray   [DISCONTINUED] OVER THE COUNTER MEDICATION Take 1-3 mLs by mouth daily as needed (Stress). Lemon Balm   [DISCONTINUED] Probiotic Product (DIGESTIVE ADVANTAGE KIDS) CHEW Chew 2 each by mouth See admin instructions. Take 2 with each meal, up to 3 times daily   [DISCONTINUED] Probiotic Product (PROBIOTIC DAILY PO) Take 1 capsule by mouth daily. Woman   [DISCONTINUED] psyllium (METAMUCIL) 58.6 % powder Take 1 packet by mouth daily.   [DISCONTINUED] Zinc  Gluconate 15 MG TABS Take 1 tablet (15 mg total) by mouth daily.   [DISCONTINUED] fluconazole (DIFLUCAN) 100 MG tablet Take 100 mg by mouth 2 (two) times daily.   [DISCONTINUED] mupirocin  cream (BACTROBAN ) 2 % Apply 1 Application topically 2  (two) times daily.   [DISCONTINUED] triamcinolone  (KENALOG ) 0.025 % ointment Apply 1 Application topically 3 (three) times daily.   No facility-administered medications prior to visit.        Objective:     BP (!) 103/6 (BP Location: Left Arm, Patient Position: Sitting, Cuff Size: Normal)   Pulse 79   Temp 97.7 F (36.5 C) (Oral)   Ht 5' 2 (1.575 m)   Wt 117 lb 3.2 oz (53.2 kg)   SpO2 99%   BMI 21.44 kg/m    Physical Exam   Gen: Well-appearing woman HEENT: Ear canals normal, bilateral tympanic membranes appear normal, not erythematous no bulging. Mouth: Normal dentition, crowded posterior oropharynx, cannot see the uvula due to a high tongue, positive history for snoring but no history of apnea NECK: Thyroid  normal.  No nodules or adenopathy CHEST: Lungs clear to auscultation, no crackles, no wheezing. CARDIOVASCULAR: Heart sounds normal, no murmur. Abd: Soft, nontender, no organomegaly SKIN: Small pigmented raised nevus on the right side of her back, looks low risk Psych: Appropriate mood and affect, not anxious or depressed appearing Neuro: Alert, conversational, full strength upper and lower extremities, normal  gait, normal balance     Assessment & Plan:    Routine Health Maintenance and Physical Exam  Immunization History  Administered Date(s) Administered   DTaP 07/06/1988, 10/08/1988, 12/10/1988, 01/13/1990, 06/13/1993   HIB, Unspecified 10/07/1989   HPV Quadrivalent 10/12/2005, 02/05/2006, 10/09/2006   Hep B, Unspecified 11/21/1999, 12/18/1999, 05/06/2000   Hepatitis A 10/12/2005   Hepatitis A, Adult 01/15/2011   IPV 07/06/1988, 10/08/1988, 01/13/1990, 06/13/1993   Influenza Whole 06/13/2011, 12/12/2011   Influenza-Unspecified 12/27/2017   MMR 01/03/1990, 06/13/1993   Meningococcal Acwy, Unspecified 10/12/2005   PPD Test 05/25/2009, 06/13/2011   Td 04/29/2003   Tdap 01/15/2011, 02/10/2018    Health Maintenance  Topic Date Due   COVID-19 Vaccine (1)  Never done   Hepatitis C Screening  Never done   Hepatitis B Vaccines (1 of 3 - 19+ 3-dose series) Never done   Cervical Cancer Screening (HPV/Pap Cotest)  Never done   INFLUENZA VACCINE  09/27/2023   DTaP/Tdap/Td (9 - Td or Tdap) 02/11/2028   HPV VACCINES  Completed   HIV Screening  Completed   Meningococcal B Vaccine  Aged Out    Discussed health benefits of physical activity, and encouraged her to engage in regular exercise appropriate for her age and condition.   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 Recurrence of left leg pain, purple toe discoloration, and cramping. DVT risk near average post-procedure. - Schedule follow-up with vascular specialist in December to assess stent necessity. - Advise wearing compression socks during the day. - Recommend preventive measures during long flights or surgeries, such as wearing compression socks and considering temporary anticoagulation.        Medium    Diverticulosis (Chronic)   Diverticulosis with two episodes of diverticulitis. Recent abdominal pain possibly related to vascular congestion.  If she has future episodes of a left abdominal discomfort, would do a CT scan to confirm that it is coming from diverticulitis before treating with antibiotics.        Low   Contraceptive management (Chronic)   Using IUD, planning removal. Advised against further pregnancies. Husband plans vasectomy. - Discuss temporary oral contraception options with gynecologist post-IUD removal.      Healthcare maintenance (Chronic)   Engages in regular exercise, healthy diet, and abstains from tobacco, alcohol, or substances. Mental health supported through therapy and spiritual practices. - Continue regular exercise and healthy diet. - Maintain mental health support through therapy and spiritual  practices.      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 Undergoing itraconazole treatment with liver function monitoring at [facility]. - itraconazole with monthly liver function tests.  No other medication interactions that I see.       Return in about 1 year (around 09/05/2024) for wellness.     Cleatus Debby Specking, MD

## 2023-09-06 NOTE — Assessment & Plan Note (Signed)
 Diverticulosis with two episodes of diverticulitis. Recent abdominal pain possibly related to vascular congestion.  If she has future episodes of a left abdominal discomfort, would do a CT scan to confirm that it is coming from diverticulitis before treating with antibiotics.

## 2023-09-06 NOTE — Patient Instructions (Signed)
  VISIT SUMMARY: During your annual physical exam, we reviewed your recent health concerns and ongoing treatments. 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 We also discussed your history of diverticulosis, anxiety management, and contraception plans.  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 You will follow up with a vascular specialist in December to assess if a stent is needed. In the meantime, wear compression socks during the day and take preventive measures during long flights or surgeries.  -DIVERTICULOSIS WITH DIVERTICULITIS: Diverticulosis involves small pouches in the colon that can become inflamed, known as diverticulitis. You have a history of this condition but no current symptoms. We will continue to monitor your condition.  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 Continue your itraconazole treatment and have monthly liver function tests. Monitor mold levels in your environment and adjust treatment as needed.  -YEAST OVERGROWTH AND PARASITIC INFECTION: Yeast overgrowth and parasitic infections can cause various symptoms and were previously treated with medications. No current treatment is needed unless symptoms recur.  -RIGHT EAR INFECTION: An ear infection can cause pain and balance issues but has resolved. No further treatment is needed.  -ANXIETY: Anxiety is being managed with supplements, therapy, yoga, and walking. Continue these practices as they are helping you.  -GENERAL HEALTH MAINTENANCE: You are maintaining good health through regular exercise, a healthy diet, and mental health support. Continue these practices to stay healthy.  -CONTRACEPTION MANAGEMENT: You are currently using an IUD for contraception and plan to  have it removed. Your husband plans to undergo a vasectomy. Discuss temporary oral contraception options with your gynecologist after IUD removal.  INSTRUCTIONS: Follow up with a vascular specialist in December to assess the necessity of a stent for May-Thurner Syndrome. Continue monthly liver function tests while on itraconazole. Discuss temporary oral contraception options with your gynecologist after IUD removal.

## 2023-09-06 NOTE — Assessment & Plan Note (Signed)
 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 Recurrence of left leg pain, purple toe discoloration, and cramping. DVT risk near average post-procedure. - Schedule follow-up with vascular specialist in December to assess stent necessity. - Advise wearing compression socks during the day. - Recommend preventive measures during long flights or surgeries, such as wearing compression socks and considering temporary anticoagulation.

## 2023-09-09 ENCOUNTER — Telehealth: Payer: Self-pay

## 2023-09-09 NOTE — Telephone Encounter (Signed)
Yes, I can do this for her.

## 2023-09-09 NOTE — Telephone Encounter (Signed)
**Note De-identified  W[place]bright Obfuscation** Please advise 

## 2023-09-09 NOTE — Telephone Encounter (Signed)
 Copied from CRM [id number]. Topic: General - Other >> Sep 09, 2023 11:07 AM Connie [name] wrote: Reason for CRM: patient called wanting to know if her primary care doctor can fill out a ergonomic equipment request form from her HR for a standing desk or do she need to go to her vascular doctor. 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 CB  [phone]

## 2023-09-13 ENCOUNTER — Other Ambulatory Visit (HOSPITAL_COMMUNITY)
Admission: RE | Admit: 2023-09-13 | Discharge: 2023-09-13 | Disposition: A | Discharge status: Home / Self Care | Source: Ambulatory Visit | Attending: Student in an Organized Health Care Education/Training Program | Admitting: Student in an Organized Health Care Education/Training Program

## 2023-09-13 ENCOUNTER — Ambulatory Visit (INDEPENDENT_AMBULATORY_CARE_PROVIDER_SITE_OTHER): Admitting: Student in an Organized Health Care Education/Training Program

## 2023-09-13 ENCOUNTER — Encounter: Payer: Self-pay | Admitting: Student in an Organized Health Care Education/Training Program

## 2023-09-13 VITALS — BP 113/68 | HR 87 | Wt 115.0 lb

## 2023-09-13 DIAGNOSIS — R34 Anuria and oliguria: Secondary | ICD-10-CM | POA: Diagnosis not present

## 2023-09-13 DIAGNOSIS — N9489 Other specified conditions associated with female genital organs and menstrual cycle: Secondary | ICD-10-CM

## 2023-09-13 DIAGNOSIS — N898 Other specified noninflammatory disorders of vagina: Secondary | ICD-10-CM | POA: Insufficient documentation

## 2023-09-13 LAB — POCT URINALYSIS DIPSTICK
Bilirubin, UA: NEGATIVE
Blood, UA: NEGATIVE
Glucose, UA: NEGATIVE
Ketones, UA: NEGATIVE
Leukocytes, UA: NEGATIVE
Nitrite, UA: NEGATIVE
Protein, UA: NEGATIVE
Spec Grav, UA: 1.02 (ref 1.010–1.025)
Urobilinogen, UA: 0.2 U/dL
pH, UA: 6 (ref 5.0–8.0)

## 2023-09-13 NOTE — Assessment & Plan Note (Signed)
 Pelvic congestion syndrome and musculoskeletal pain are considered. Urinalysis is negative for infection. Differential includes bacterial vaginosis due to discharge changes and intraconazole use. Perform a vaginal swab for bacterial vaginosis. Continue boric acid supplements, one at night. Call on Monday with test results to discuss treatment if infection is present.  May need to follow-up with vascular to consider further interventions for her known vascular congestion issues.

## 2023-09-13 NOTE — Patient Instructions (Signed)
  VISIT SUMMARY: During your visit, we discussed your recent pelvic pressure and pain, changes in urinary habits, and leg cramping. We also reviewed your general health maintenance and provided recommendations to help alleviate your symptoms.  YOUR PLAN: -PELVIC PAIN: Pelvic pain can be caused by various conditions, including pelvic congestion syndrome and musculoskeletal pain. Your urinalysis was negative for infection, but we are considering bacterial vaginosis due to changes in your discharge and your use of itraconazole. We performed a vaginal swab to test for bacterial vaginosis. Please continue using boric acid supplements, one at night. Call us  on Monday to discuss the test results and potential treatment if an infection is present.  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 We are waiting for a response from your vascular specialist. Please contact your vascular specialist to try to get an earlier appointment and get on the waitlist for any cancellations.  -GENERAL HEALTH MAINTENANCE: To help alleviate your symptoms, we recommend staying well-hydrated and using a standing desk at work to reduce discomfort.  INSTRUCTIONS: Please call us  on Monday to discuss the results of your vaginal swab test. Additionally, contact your vascular specialist to try to get an earlier appointment and get on the waitlist for any cancellations.

## 2023-09-13 NOTE — Progress Notes (Signed)
 Acute Office Visit  Subjective:     Patient ID: Connie [name], female    DOB: 1990-05-09, 33 y.o.   MRN: [id number]  Chief Complaint  Patient presents with   Pelvic Pain    Patient states she has  a lot of pelvic pressure and having some pain in lower side/back area. Patient states she urinates frequently but has notice she has not urinated much in the last couple of days.      HPI  Discussed the use of AI scribe software for clinical note transcription with the patient, who gave verbal consent to proceed.  History of Present Illness Jeremy Savona is a 33 year old female who presents with pelvic pressure and pain. She is accompanied by her young son.  She has been experiencing pelvic pressure and pain for the past three days, with intermittent pain that sometimes eases. The discomfort extends to her back, and she describes a sensation of pressure that she feels unable to release, which has persisted since waking up today.  There has been a change in her urinary habits, with a decrease in frequency over the past two days, despite usually urinating frequently throughout the day. She suspects possible dehydration as she has also experienced a headache. There is a slight burning sensation during urination, but it is not severe. No fever is present.  She reports a mucous-like vaginal discharge last week, for which she started using boric acid supplements for four days but then stopped. She denies any current vaginal discharge, itching, or pain. She has an IUD and does not experience menstrual periods.  She has a history of scoliosis and has been standing more at work, which she thinks might be contributing to her discomfort. She is in the process of obtaining a standing desk to help alleviate her symptoms.  She has been on itraconazole since September 01, 2023, and questions if it could be affecting her symptoms. There is no swelling in her legs, but she has experienced cramping, particularly in  one leg, which she attributes to the heat. She has reached out to her vascular doctor regarding these symptoms.        Objective:    BP 113/68   Pulse 87   Wt 115 lb (52.2 kg)   SpO2 100%   BMI 21.03 kg/m    Physical Exam  Gen: Appearing woman Heart: Regular, no murmur Lungs: Unlabored, clear throughout Abd: Soft, nontender, mild discomfort over the bladder, no organomegaly Ext: Warm, no edema, normal joints   Results for orders placed or performed in visit on 09/13/23  POCT Urinalysis Dipstick  Result Value Ref Range   Color, UA Yellow    Clarity, UA Clear    Glucose, UA Negative Negative   Bilirubin, UA negative    Ketones, UA negative    Spec Grav, UA 1.020 1.010 - 1.025   Blood, UA negative    pH, UA 6.0 5.0 - 8.0   Protein, UA Negative Negative   Urobilinogen, UA 0.2 0.2 or 1.0 E.U./dL   Nitrite, UA negative    Leukocytes, UA Negative Negative   Appearance     Odor          Assessment & Plan:    Problem List Items Addressed This Visit       High   Pelvic congestion syndrome (Chronic)   Pelvic congestion syndrome and musculoskeletal pain are considered. Urinalysis is negative for infection. Differential includes bacterial vaginosis due to discharge changes and intraconazole use.  Perform a vaginal swab for bacterial vaginosis. Continue boric acid supplements, one at night. Call on Monday with test results to discuss treatment if infection is present.  May need to follow-up with vascular to consider further interventions for her known vascular congestion issues.        Unprioritized   Vaginal discharge   Relevant Orders   Cervicovaginal ancillary only   Other Visit Diagnoses       Urination decrease    -  Primary   Relevant Orders   POCT Urinalysis Dipstick (Completed)       Return if symptoms worsen or fail to improve.  Cleatus Debby Specking, MD

## 2023-09-17 ENCOUNTER — Ambulatory Visit: Payer: Self-pay | Admitting: Student in an Organized Health Care Education/Training Program

## 2023-09-17 ENCOUNTER — Telehealth: Payer: Self-pay

## 2023-09-17 LAB — CERVICOVAGINAL ANCILLARY ONLY
Bacterial Vaginitis (gardnerella): NEGATIVE
Candida Glabrata: NEGATIVE
Candida Vaginitis: NEGATIVE
Chlamydia: NEGATIVE
Comment: NEGATIVE
Comment: NEGATIVE
Comment: NEGATIVE
Comment: NEGATIVE
Comment: NEGATIVE
Comment: NORMAL
Neisseria Gonorrhea: NEGATIVE
Trichomonas: NEGATIVE

## 2023-09-17 NOTE — Telephone Encounter (Signed)
 Pt called with c/o several weeks of worsening pelvic pressure that is also felt in LLE with cramping. She was seen last Friday by her PCP who did testing for BV and UTI. She has been scheduled for an IVC/Iliac duplex tomorrow. Pt is aware of this appt and her MD f/u in Sept. She is also aware to report to Urgent Care/ED if the pain is severe.

## 2023-09-18 ENCOUNTER — Ambulatory Visit (HOSPITAL_COMMUNITY)
Admission: RE | Admit: 2023-09-18 | Discharge: 2023-09-18 | Disposition: A | Discharge status: Home / Self Care | Source: Ambulatory Visit | Attending: Vascular Surgery | Admitting: Vascular Surgery

## 2023-09-18 DIAGNOSIS — I871 Compression of vein: Secondary | ICD-10-CM | POA: Insufficient documentation

## 2023-09-30 ENCOUNTER — Telehealth: Payer: Self-pay

## 2023-09-30 NOTE — Telephone Encounter (Signed)
 Patient called concerned about throbbing in her upper left leg down to her knee after jumping on a trampoline. Patient reported throbbing has resolved. Patient trying to figure out what exacerbates her symptoms of May-Turner Syndrome.  Pt reports she is wearing her compression stockings and taking Lumbrokinase. She reports relief of symptoms with these therapies.  Pt is looking forward to her follow-up appt in September.  Patient knows to go to the ED if she experiences acute shortness of breath and/or swelling of her left leg.

## 2023-10-25 DIAGNOSIS — Z6821 Body mass index (BMI) 21.0-21.9, adult: Secondary | ICD-10-CM | POA: Diagnosis not present

## 2023-10-25 DIAGNOSIS — Z01419 Encounter for gynecological examination (general) (routine) without abnormal findings: Secondary | ICD-10-CM | POA: Diagnosis not present

## 2023-10-25 DIAGNOSIS — R87610 Atypical squamous cells of undetermined significance on cytologic smear of cervix (ASC-US): Secondary | ICD-10-CM | POA: Diagnosis not present

## 2023-11-04 ENCOUNTER — Ambulatory Visit: Attending: Surgery | Admitting: Surgery

## 2023-11-04 ENCOUNTER — Encounter: Payer: Self-pay | Admitting: Surgery

## 2023-11-04 VITALS — BP 114/78 | HR 86 | Temp 98.5°F | Ht 62.0 in | Wt 118.0 lb

## 2023-11-04 DIAGNOSIS — I871 Compression of vein: Secondary | ICD-10-CM

## 2023-11-04 DIAGNOSIS — N9489 Other specified conditions associated with female genital organs and menstrual cycle: Secondary | ICD-10-CM | POA: Diagnosis not present

## 2023-11-04 NOTE — Progress Notes (Signed)
 Vascular and Vein Specialist of [facility]  Patient name: Connie [name] MRN: [id number] DOB: [birth year] Sex: female   REASON FOR VISIT:    Follow-up  HISOTRY OF PRESENT ILLNESS:    Connie [name] is a 33 y.o. female who I initially saw for pelvic congestion syndrome.  She was having issues with abdominal pain.  She has a history of diverticulosis as well as IBS.  She had a CT scan that showed iliac vein compression.  She went for venography on 12/18/2022.  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  She was also found to have a dilated left ovarian vein that refluxed into the pelvic veins.  Her pain significantly improved with treatment initially.  She is back today for follow-up.  She says that she tried rebounding on a trampoline and had severe left leg pain.  She will also report leg heaviness with standing.  This is helped by compression socks and elevation.  Her doctor also recently gave her a new medicine which alleviated a lot of her symptoms.  She does complain of some pain around her medial left knee    The patient has a known liver lesion consistent with FNH.  She has had several episodes of diverticulitis.  She has had esophageal dilatation in 2023.  She also suffers from heartburn.  She does complain of left leg.  She has sciatica as well.    PAST MEDICAL HISTORY:   Past Medical History:  Diagnosis Date   Diverticulitis 06/2019   Focal nodular hyperplasia of liver 2017   Stable imaging x4 years.    Hypoglycemic reaction    IBS (irritable bowel syndrome)      FAMILY HISTORY:   Family History  Problem Relation Age of Onset   Hypertension Mother    Esophageal cancer Father    Hypertension Sister    Heart attack Other    Colon cancer Neg Hx    Inflammatory bowel disease Neg Hx     SOCIAL HISTORY:   Social History   Tobacco Use   Smoking status: Never    Smokeless tobacco: Never  Substance Use Topics   Alcohol use: No     ALLERGIES:   Allergies  Allergen Reactions   Other Diarrhea and Other (See Comments)    SESAME SEEDS-abdominal cramping     CURRENT MEDICATIONS:   Current Outpatient Medications  Medication Sig Dispense Refill   albuterol  (VENTOLIN  HFA) 108 (90 Base) MCG/ACT inhaler Inhale 1-2 puffs into the lungs every 6 (six) hours as needed for shortness of breath or wheezing. 8 g 0   itraconazole (SPORANOX) 100 MG capsule Take 100 mg by mouth daily.     levonorgestrel  (MIRENA , 52 MG,) 20 MCG/DAY IUD 1 each by Intrauterine route once. Placed in 07/2018     NALTREXONE HCL PO Take 3 mg by mouth daily.     No current facility-administered medications for this visit.    REVIEW OF SYSTEMS:   [X]  denotes positive finding, [ ]  denotes negative finding Cardiac  Comments:  Chest pain or chest pressure:    Shortness of breath upon exertion:    Short of breath when lying flat:    Irregular heart rhythm:        Vascular    Pain in calf, thigh, or hip brought on by ambulation:    Pain in feet at night that wakes you up from your sleep:  Blood clot in your veins:    Leg swelling:         Pulmonary    Oxygen at home:    Productive cough:     Wheezing:         Neurologic    Sudden weakness in arms or legs:     Sudden numbness in arms or legs:     Sudden onset of difficulty speaking or slurred speech:    Temporary loss of vision in one eye:     Problems with dizziness:         Gastrointestinal    Blood in stool:     Vomited blood:         Genitourinary    Burning when urinating:     Blood in urine:        Psychiatric    Major depression:         Hematologic    Bleeding problems:    Problems with blood clotting too easily:        Skin    Rashes or ulcers:        Constitutional    Fever or chills:      PHYSICAL EXAM:   Vitals:   11/04/23 1023  BP: 114/78  Pulse: 86  Temp: 98.5 F (36.9 C)   SpO2: 99%  Weight: 118 lb (53.5 kg)  Height: 5' 2 (1.575 m)    GENERAL: The patient is a well-nourished female, in no acute distress. The vital signs are documented above. CARDIAC: There is a regular rate and rhythm.  PULMONARY: Non-labored respirations ABDOMEN: Soft and non-tender with normal pitched bowel sounds.  MUSCULOSKELETAL: There are no major deformities or cyanosis. NEUROLOGIC: No focal weakness or paresthesias are detected. SKIN: There are no ulcers or rashes noted. PSYCHIATRIC: The patient has a normal affect.  STUDIES:   I have reviewed the following: IVC/Iliac: Patent IVC, left CIV and EIV.   MEDICAL ISSUES:   May-Thurner and pelvic congestion syndrome: The patient's ultrasound suggest no significant stenosis in the left iliac venous system, however she does describe some leg heaviness.  I suspect that she still has a slight narrowing in her iliac vein although we did not see it on ultrasound.  I have encouraged her to continue with exercise and to wear compression when necessary.  With regards to her pelvic congestion, she is not having any significant pelvic pain and does note improvement since venoplasty of her iliac vein stenosis.  The next step would be embolization of her ovarian vein, however she would like to avoid a procedure if at all possible.  Her biggest concern is a DVT from her May-Thurner.  I plan on surveillance of her iliac stenosis to minimize this risk.  She will return in 6 months with a ilio caval ultrasound    Malvina Serene CLORE, MD, FACS [facility] of [facility] [phone] Pager [phone]

## 2023-11-05 ENCOUNTER — Other Ambulatory Visit: Payer: Self-pay

## 2023-11-05 DIAGNOSIS — I871 Compression of vein: Secondary | ICD-10-CM

## 2023-11-05 DIAGNOSIS — I771 Stricture of artery: Secondary | ICD-10-CM

## 2023-11-05 NOTE — Telephone Encounter (Signed)
 I have printed forms and put them on providers desk for review.

## 2023-11-05 NOTE — Telephone Encounter (Signed)
 Called patient and let her know that paperwork has been completed. Patient would like paperwork faxed to [phone].  I have faxed paperwork to the above number. Faxed paperwork to be scanned to patients chart.   Patient is going to let me know if she does not receive paperwork via fax in 30 minutes.

## 2023-11-05 NOTE — Telephone Encounter (Signed)
 I have completed the paperwork and placed in the MA basket.  Thank you.

## 2023-11-14 DIAGNOSIS — H04123 Dry eye syndrome of bilateral lacrimal glands: Secondary | ICD-10-CM | POA: Diagnosis not present

## 2023-11-14 DIAGNOSIS — H40013 Open angle with borderline findings, low risk, bilateral: Secondary | ICD-10-CM | POA: Diagnosis not present

## 2023-11-14 DIAGNOSIS — H538 Other visual disturbances: Secondary | ICD-10-CM | POA: Diagnosis not present

## 2023-12-06 DIAGNOSIS — Z1321 Encounter for screening for nutritional disorder: Secondary | ICD-10-CM | POA: Diagnosis not present

## 2023-12-06 DIAGNOSIS — K589 Irritable bowel syndrome without diarrhea: Secondary | ICD-10-CM | POA: Diagnosis not present

## 2023-12-06 DIAGNOSIS — Z1329 Encounter for screening for other suspected endocrine disorder: Secondary | ICD-10-CM | POA: Diagnosis not present

## 2023-12-06 DIAGNOSIS — E559 Vitamin D deficiency, unspecified: Secondary | ICD-10-CM | POA: Diagnosis not present

## 2023-12-06 DIAGNOSIS — E7210 Disorders of sulfur-bearing amino-acid metabolism, unspecified: Secondary | ICD-10-CM | POA: Diagnosis not present

## 2023-12-06 DIAGNOSIS — F419 Anxiety disorder, unspecified: Secondary | ICD-10-CM | POA: Diagnosis not present

## 2023-12-06 DIAGNOSIS — K219 Gastro-esophageal reflux disease without esophagitis: Secondary | ICD-10-CM | POA: Diagnosis not present

## 2023-12-06 DIAGNOSIS — R5383 Other fatigue: Secondary | ICD-10-CM | POA: Diagnosis not present

## 2023-12-31 DIAGNOSIS — D2361 Other benign neoplasm of skin of right upper limb, including shoulder: Secondary | ICD-10-CM | POA: Diagnosis not present

## 2023-12-31 DIAGNOSIS — L858 Other specified epidermal thickening: Secondary | ICD-10-CM | POA: Diagnosis not present

## 2023-12-31 DIAGNOSIS — L905 Scar conditions and fibrosis of skin: Secondary | ICD-10-CM | POA: Diagnosis not present

## 2023-12-31 DIAGNOSIS — D2239 Melanocytic nevi of other parts of face: Secondary | ICD-10-CM | POA: Diagnosis not present

## 2023-12-31 DIAGNOSIS — D2262 Melanocytic nevi of left upper limb, including shoulder: Secondary | ICD-10-CM | POA: Diagnosis not present

## 2023-12-31 DIAGNOSIS — D225 Melanocytic nevi of trunk: Secondary | ICD-10-CM | POA: Diagnosis not present

## 2023-12-31 DIAGNOSIS — D485 Neoplasm of uncertain behavior of skin: Secondary | ICD-10-CM | POA: Diagnosis not present

## 2023-12-31 DIAGNOSIS — D2272 Melanocytic nevi of left lower limb, including hip: Secondary | ICD-10-CM | POA: Diagnosis not present

## 2024-01-01 DIAGNOSIS — H04123 Dry eye syndrome of bilateral lacrimal glands: Secondary | ICD-10-CM | POA: Diagnosis not present

## 2024-01-01 DIAGNOSIS — H538 Other visual disturbances: Secondary | ICD-10-CM | POA: Diagnosis not present

## 2024-01-01 LAB — OPHTHALMOLOGY REPORT-SCANNED

## 2024-01-14 DIAGNOSIS — D485 Neoplasm of uncertain behavior of skin: Secondary | ICD-10-CM | POA: Diagnosis not present

## 2024-01-14 DIAGNOSIS — L988 Other specified disorders of the skin and subcutaneous tissue: Secondary | ICD-10-CM | POA: Diagnosis not present

## 2024-02-09 ENCOUNTER — Emergency Department (HOSPITAL_COMMUNITY)
Admission: EM | Admit: 2024-02-09 | Discharge: 2024-02-09 | Disposition: A | Discharge status: Home / Self Care | Attending: Emergency Medicine | Admitting: Emergency Medicine

## 2024-02-09 ENCOUNTER — Encounter (HOSPITAL_COMMUNITY): Payer: Self-pay

## 2024-02-09 ENCOUNTER — Other Ambulatory Visit: Payer: Self-pay

## 2024-02-09 DIAGNOSIS — R519 Headache, unspecified: Secondary | ICD-10-CM | POA: Diagnosis not present

## 2024-02-09 DIAGNOSIS — G43909 Migraine, unspecified, not intractable, without status migrainosus: Secondary | ICD-10-CM

## 2024-02-09 MED ORDER — ONDANSETRON HCL 4 MG PO TABS: 4.0000 mg | ORAL_TABLET | Freq: Four times a day (QID) | ORAL | 0 refills | Status: AC

## 2024-02-09 MED ORDER — KETOROLAC TROMETHAMINE 30 MG/ML IJ SOLN
30.0000 mg | Freq: Once | INTRAMUSCULAR | Status: AC
  Administered 2024-02-09: 30 mg via INTRAMUSCULAR
  Filled 2024-02-09: qty 1

## 2024-02-09 MED ORDER — NAPROXEN 500 MG PO TABS: 500.0000 mg | ORAL_TABLET | Freq: Two times a day (BID) | ORAL | 0 refills | Status: AC

## 2024-02-09 NOTE — ED Provider Notes (Signed)
  EMERGENCY DEPARTMENT AT [facility] Provider Note   CSN: [id number] Arrival date & time: 02/09/24  2034     Patient presents with: Headache   Connie [name] is a 33 y.o. female.  With a history of May Thurner syndrome presents today with left-sided headache that started yesterday.  She states that she had a sudden onset left-sided headache around her eyes with nausea yesterday that lasted about for a few minutes.  The patient reports that the symptoms returned today in the evening but only lasted for about 20 minutes.  During that time she had nausea photophobia and dizziness. Notes that the headache radiates down to her neck. She has had migraines before in the past but not this intensity. denies weakness numbness tingling fever chills shortness of breath, hearing difficulties, ear pain or any other symptoms at this time.  The headache has subsided at this time. Pain is 4/10 at this time.       Headache      Prior to Admission medications  Medication Sig Start Date End Date Taking? Authorizing Provider  naproxen  (NAPROSYN ) 500 MG tablet Take 1 tablet (500 mg total) by mouth 2 (two) times daily. 02/09/24  Yes Reynalda Canny, PA-C  ondansetron  (ZOFRAN ) 4 MG tablet Take 1 tablet (4 mg total) by mouth every 6 (six) hours. 02/09/24  Yes Delma Villalva, PA-C  albuterol  (VENTOLIN  HFA) 108 (90 Base) MCG/ACT inhaler Inhale 1-2 puffs into the lungs every 6 (six) hours as needed for shortness of breath or wheezing. 01/21/22   Fleming, Zelda W, NP  itraconazole (SPORANOX) 100 MG capsule Take 100 mg by mouth daily. 09/01/23   [provider]  levonorgestrel  (MIRENA , 52 MG,) 20 MCG/DAY IUD 1 each by Intrauterine route once. Placed in 07/2018 07/18/18   [provider]  NALTREXONE HCL PO Take 3 mg by mouth daily.    [provider]    Allergies: Other    Review of Systems  Neurological:  Positive for headaches.    Updated Vital Signs BP 114/77    Pulse 78   Temp (!) 97.5 F (36.4 C) (Oral)   Resp 16   Ht 5' 2 (1.575 m)   Wt 54.4 kg   SpO2 100%   BMI 21.95 kg/m   Physical Exam Vitals and nursing note reviewed.  Constitutional:      General: She is not in acute distress.    Appearance: She is well-developed.  HENT:     Head: Normocephalic and atraumatic.  Eyes:     Conjunctiva/sclera: Conjunctivae normal.  Cardiovascular:     Rate and Rhythm: Normal rate and regular rhythm.     Heart sounds: No murmur heard. Pulmonary:     Effort: Pulmonary effort is normal. No respiratory distress.     Breath sounds: Normal breath sounds. No decreased breath sounds, wheezing, rhonchi or rales.  Abdominal:     Palpations: Abdomen is soft.     Tenderness: There is no abdominal tenderness.  Musculoskeletal:        General: No swelling.     Cervical back: Neck supple.  Skin:    General: Skin is warm and dry.     Capillary Refill: Capillary refill takes less than 2 seconds.  Neurological:     General: No focal deficit present.     Mental Status: She is alert and oriented to person, place, and time.     Cranial Nerves: Cranial nerves 2-12 are intact.     Sensory:  Sensation is intact.     Motor: Motor function is intact.     Coordination: Coordination is intact.     Gait: Gait is intact.     Comments: Speech is clear and goal oriented, follows commands Major Cranial nerves without deficit, no facial droop Normal strength in upper and lower extremities bilaterally including dorsiflexion and plantar flexion, strong and equal grip strength Sensation normal to light and sharp touch Moves extremities without ataxia, coordination intact Normal finger to nose and rapid alternating movements Neg romberg, no pronator drift Normal gait and balance   Psychiatric:        Mood and Affect: Mood normal.     (all labs ordered are listed, but only abnormal results are displayed) Labs Reviewed - No data to  display  EKG: None  Radiology: No results found.   Procedures   Medications Ordered in the ED  ketorolac  (TORADOL ) 30 MG/ML injection 30 mg (30 mg Intramuscular Given 02/09/24 2201)                                    Medical Decision Making      Presentation is like pts typical HA and non concerning for [facility], ICH, Meningitis, or temporal arteritis. Pt is afebrile with no focal neuro deficits, not in distress, no nuchal rigidity, or change in vision. Pt is to follow up with PCP for further management. reviewed MRI from 03/16/2022 where was negative.  Provided her with Zofran  and naproxen  as needed for her symptoms. Pt verbalizes understanding and is agreeable with plan to dc.        Final diagnoses:  Migraine without status migrainosus, not intractable, unspecified migraine type    ED Discharge Orders          Ordered    ondansetron  (ZOFRAN ) 4 MG tablet  Every 6 hours        02/09/24 2201    naproxen  (NAPROSYN ) 500 MG tablet  2 times daily        02/09/24 2201               Braxton Dubois, PA-C 02/09/24 2205    Cleotilde Rogue, MD 02/09/24 2209

## 2024-02-09 NOTE — ED Triage Notes (Signed)
 Pt c/o of a migrane starting yesterday that subsided last night. Tonight she reports sharp sudden pain behind left eye, that moved to whole left side of head and down neck. Pain lasted for 15 minutes and now pain is a 4.

## 2024-02-10 ENCOUNTER — Encounter: Payer: Self-pay | Admitting: Student in an Organized Health Care Education/Training Program

## 2024-02-10 ENCOUNTER — Ambulatory Visit (INDEPENDENT_AMBULATORY_CARE_PROVIDER_SITE_OTHER): Admitting: Student in an Organized Health Care Education/Training Program

## 2024-02-10 ENCOUNTER — Ambulatory Visit: Payer: Self-pay

## 2024-02-10 VITALS — BP 120/72 | HR 78 | Wt 117.0 lb

## 2024-02-10 DIAGNOSIS — G43909 Migraine, unspecified, not intractable, without status migrainosus: Secondary | ICD-10-CM | POA: Diagnosis not present

## 2024-02-10 MED ORDER — SUMATRIPTAN SUCCINATE 50 MG PO TABS: 50.0000 mg | ORAL_TABLET | ORAL | 0 refills | Status: AC | PRN

## 2024-02-10 NOTE — Progress Notes (Signed)
 Acute Office Visit  Patient ID: Connie [name], female    DOB: [birth year], 33 y.o.   MRN: [id number]  PCP: Jerrell Cleatus Ned, MD  Chief Complaint  Patient presents with   Headache    Was seen in ER yesterday for sudden onset of headache, Patient is worried she may have a blood clot.     Subjective:     HPI  Discussed the use of AI scribe software for clinical note transcription with the patient, who gave verbal consent to proceed.  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 She is accompanied by her husband, Connie [name].  She has been experiencing severe headaches over the past three days, described as 'migraine-ish'. The first headache began on Saturday, initially relieved by Tylenol , but returned later that night with increased intensity, described as feeling like 'someone just smacked me right here on my forehead'. This was accompanied by a sensation of heat and nausea. The headache resolved by the next morning but returned the following night during her son's play, described as a stabbing pain in the eye, radiating upwards and causing neck stiffness. The pain lasted approximately 15 to 20 minutes and resolved by the time she returned home.  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 Her last MRI was over a year ago, prior to her diagnosis of May-Thurner syndrome. She denies any recent changes in her medications, except for a temporary lapse in taking itraconazole, which she resumed last night. She reports increased leg pain and cramping in the days leading up to the headaches, attributing it to high stress and possible dehydration from being 'go, go, go' for the past two weeks. She has been consuming body armor drinks and water  to rehydrate.  During the review of symptoms, she reports light sensitivity during the headache episodes,  stabbing and burning sensation during the headaches, with associated nausea and dry heaving. The headaches typically last less than an hour, with the first one lasting a full day. She currently experiences lingering tenderness in the forehead and behind the eye. No watering of the eyes or vision changes.      Objective:    BP 120/72   Pulse 78   Wt 117 lb (53.1 kg)   SpO2 100%   BMI 21.40 kg/m   Physical Exam  Gen: Well-appearing woman Neuro: Alert, conversational, full strength upper and lower extremities, normal gait and balance, normal cranial nerves, normal sensation throughout, extraocular motion is normal, normal peripheral vision Neck: Normal thyroid , no nodules or adenopathy Heart: Regular, no murmur Lungs: Unlabored, clear throughout Abd: Soft and nontender Ext: Warm, compression stockings in place in both legs, there is no edema     Assessment & Plan:   Problem List Items Addressed This Visit       Unprioritized   Migraine syndrome - Primary   We headache syndrome symptoms seem like a mix of headache types.  Some days she has headaches that sound consistent with migraines, not fully treated with Tylenol .  More recently she had a headache that clinically sounded like a cold stimulus headache.  Other times she has headaches that sound more like tension or cervicogenic.  The brief nature of the headache seems low risk for trigeminal neuralgia.  Her neuroexam today is totally normal and reassuring.  We talked about the low risk of May-Thurner contributing to headaches.  She uses a few holistic medications prescribed by [facility] including itraconazole and ivermectin, I recommended pausing those treatments for a while as they both can contribute to headache syndromes.  Ultimately I think that she is under a lot of stress right now, and we talked about lifestyle interventions to improve sleep, exercise, diet, hydration, and reduce stress were able.  I think these interventions will  be the most helpful in managing her headache.  No red flags today, or other high risk symptoms to warrant imaging.  If the migraine symptoms worsen, I recommended using ibuprofen , if no relief with that I recommended starting sumatriptan  hand.  I sent a prescription of that to her pharmacy to be used as needed for severe migraine headaches.      Relevant Medications   SUMAtriptan  (IMITREX ) 50 MG tablet     Meds ordered this encounter  Medications   SUMAtriptan  (IMITREX ) 50 MG tablet    Sig: Take 1 tablet (50 mg total) by mouth every 2 (two) hours as needed for migraine. May repeat in 2 hours if headache persists or recurs.    Dispense:  10 tablet    Refill:  0    Return if symptoms worsen or fail to improve.  Cleatus Debby Specking, MD  [facility]

## 2024-02-10 NOTE — Telephone Encounter (Signed)
 Sure. I can work her in today. Ok to [employer] a spot. Thank you.

## 2024-02-10 NOTE — Patient Instructions (Signed)
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 These headaches have been described as migraine-like and have been accompanied by nausea, light sensitivity, and neck stiffness. She also reported increased leg pain and cramping, which she attributes to stress and possible dehydration. Her last MRI was over a year ago, and she has recently resumed taking itraconazole after a temporary lapse.  YOUR PLAN: -MIGRAINE AND COLD STIMULUS TYPE HEADACHES: You are experiencing headaches that have characteristics of both migraines and cold stimulus headaches. These may be caused by dehydration, stress, and your medication itraconazole. For severe migraines, you are prescribed sumatriptan  to take as needed. For short-term relief, you can use ibuprofen  or Aleve . It is important to stay hydrated, reduce stress, get adequate sleep, and exercise regularly. We also recommend pausing itraconazole to see if it is contributing to your headaches.  -PELVIC CONGESTION SYNDROME: Pelvic Congestion Syndrome is a condition that causes pelvic discomfort, which can be worsened by NSAIDs like naproxen . You should avoid naproxen  and consider using ibuprofen  or Aleve  for short-term headache relief.  -MOLD EXPOSURE WITH ELEVATED ANTIBODIES: You are being treated with itraconazole for mold exposure. Although you have recently missed some doses, this is not related to your headaches. Continue taking itraconazole until March, but you may consider pausing it temporarily if your headaches persist.  INSTRUCTIONS: Please follow up if your headaches do not improve or if you experience any new or worsening symptoms. Continue to monitor your hydration and stress levels, and make sure to get adequate rest and exercise. If you have any concerns about your medications or symptoms, do not hesitate to contact us .

## 2024-02-10 NOTE — Assessment & Plan Note (Addendum)
 We headache syndrome symptoms seem like a mix of headache types.  Some days she has headaches that sound consistent with migraines, not fully treated with Tylenol .  More recently she had a headache that clinically sounded like a cold stimulus headache.  Other times she has headaches that sound more like tension or cervicogenic.  The brief nature of the headache seems low risk for trigeminal neuralgia.  Her neuroexam today is totally normal and reassuring.  We talked about the low risk of May-Thurner contributing to headaches.  She uses a few holistic medications prescribed by [facility] including itraconazole and ivermectin, I recommended pausing those treatments for a while as they both can contribute to headache syndromes.  Ultimately I think that she is under a lot of stress right now, and we talked about lifestyle interventions to improve sleep, exercise, diet, hydration, and reduce stress were able.  I think these interventions will be the most helpful in managing her headache.  No red flags today, or other high risk symptoms to warrant imaging.  If the migraine symptoms worsen, I recommended using ibuprofen , if no relief with that I recommended starting sumatriptan  hand.  I sent a prescription of that to her pharmacy to be used as needed for severe migraine headaches.

## 2024-02-10 NOTE — Telephone Encounter (Signed)
 FYI Only or Action Required?: Action required by provider: Requesting hospital follow up for today - Seen for one sided HA last night. Pt has hx of blood clots. PT is requesting a call back this morning regarding appt or possible further testing  Patient was last seen in primary care on 09/13/2023 by Jerrell Cleatus Ned, MD.  Called Nurse Triage reporting Headache.  Symptoms began yesterday.  Interventions attempted: Other: went to ED.  Symptoms are: rapidly improving. - HA is much better today - pt is concerned about possible blood clot.   Triage Disposition: See Today in Office  Patient/caregiver understands and will follow disposition?: Yes                       Copied from CRM #[id number]. Topic: Clinical - Red Word Triage >> Feb 10, 2024  8:19 AM Aleatha C wrote: Red Word that prompted transfer to Nurse Triage: Santina to er last night, migraine with dry heeving and left side of face and is at risk at blot clots Reason for Disposition  [1] MODERATE headache (e.g., interferes with normal activities) AND [2] present > 24 hours AND [3] unexplained  (Exceptions: Pain medicines not tried, typical migraine, or headache part of viral illness.)  Answer Assessment - Initial Assessment Questions 1. LOCATION: Where does it hurt?      Forehead behind eye 2. ONSET: When did the headache start? (e.g., minutes, hours, days)      Yesterday - then mostly resolved. Then HA came back suddenly and very painful. Pt went to ED 3. PATTERN: Does the pain come and go, or has it been constant since it started?     HA is rather mild - comes and goes 4. SEVERITY: How bad is the pain? and What does it keep you from doing?  (e.g., Scale 1-10; mild, moderate, or severe)     Right now mild 5. RECURRENT SYMPTOM: Have you ever had headaches before? If Yes, ask: When was the last time? and What happened that time?      yes 6. CAUSE: What do you think is causing the headache?      Migraine - pt has blood clotting disorder and has concerns that this may be a blood clot 7. MIGRAINE: Have you been diagnosed with migraine headaches? If Yes, ask: Is this headache similar?      Yes - no exactly 8. HEAD INJURY: Has there been any recent injury to your head?      no 9. OTHER SYMPTOMS: Do you have any other symptoms? (e.g., fever, stiff neck, eye pain, sore throat, cold symptoms)     no  Protocols used: Headache-A-AH

## 2024-02-10 NOTE — Telephone Encounter (Signed)
 scheduled

## 2024-03-25 ENCOUNTER — Ambulatory Visit
Admission: RE | Admit: 2024-03-25 | Discharge: 2024-03-25 | Disposition: A | Discharge status: Home / Self Care | Attending: Family Medicine | Admitting: Family Medicine

## 2024-03-25 VITALS — BP 124/89 | HR 99 | Temp 99.1°F | Resp 18

## 2024-03-25 DIAGNOSIS — J029 Acute pharyngitis, unspecified: Secondary | ICD-10-CM | POA: Insufficient documentation

## 2024-03-25 DIAGNOSIS — R509 Fever, unspecified: Secondary | ICD-10-CM | POA: Insufficient documentation

## 2024-03-25 LAB — POCT RAPID STREP A (OFFICE): Rapid Strep A Screen: NEGATIVE

## 2024-03-25 MED ORDER — AZELASTINE HCL 0.1 % NA SOLN: 1.0000 | Freq: Two times a day (BID) | NASAL | 0 refills | Status: AC

## 2024-03-25 MED ORDER — LIDOCAINE VISCOUS HCL 2 % MT SOLN: 10.0000 mL | OROMUCOSAL | 0 refills | Status: AC | PRN

## 2024-03-25 NOTE — ED Triage Notes (Signed)
 Pt reports sore throat, seems to get worse at night has gargled warm salt water  and drinking warm fluids x 2 days had low grade fever. Body aches.

## 2024-03-26 NOTE — ED Provider Notes (Signed)
 " [facility]    CSN: [id number] Arrival date & time: 03/25/24  1549      History   Chief Complaint Chief Complaint  Patient presents with   Sore Throat    Painful to swallow for a few days now. Also had chills and slight aches two nights ago that came and went yesterday. No fever, staying right under 100 if thermometer is correct. - Entered by patient    HPI Ebonie Hewes is a 34 y.o. female.   Patient presenting today with about 2 days of sore throat, rhinorrhea, low-grade fever, body aches, fatigue.  Denies cough, chest pain, shortness of breath, vomiting, diarrhea, rashes.  So far gargling salt water , drinking fluids with minimal relief.  Multiple sick contacts recently.    Past Medical History:  Diagnosis Date   Diverticulitis 06/2019   Focal nodular hyperplasia of liver 2017   Stable imaging x4 years.    Hypoglycemic reaction    IBS (irritable bowel syndrome)     Patient Active Problem List   Diagnosis Date Noted   Migraine syndrome 02/10/2024   Pelvic congestion syndrome 09/06/2023   May-Thurner syndrome 09/06/2023   Mold exposure 09/06/2023   Healthcare maintenance 09/06/2023   Diverticulosis 09/06/2023   Gastroesophageal reflux disease 04/18/2022   Contraceptive management 04/03/2022   IBS (irritable bowel syndrome) 08/20/2019   Focal nodular hyperplasia of liver 04/16/2016    Past Surgical History:  Procedure Laterality Date   BIOPSY  12/25/2021   Procedure: BIOPSY;  Surgeon: Shaaron Lamar HERO, MD;  Location: [facility];  Service: Endoscopy;;   COLONOSCOPY N/A 12/02/2019   Procedure: COLONOSCOPY;  Surgeon: Shaaron Lamar HERO, MD; diverticulosis in the sigmoid and descending colon, otherwise normal exam.  Repeat colonoscopy at age 54.   ESOPHAGOGASTRODUODENOSCOPY (EGD) WITH PROPOFOL  N/A 12/25/2021   Surgeon: Shaaron Lamar HERO, MD; Normal esophagus s/p dilation and biopsy, small hiatal hernia.   LOWER EXTREMITY VENOGRAPHY N/A 12/18/2022    Procedure: LOWER EXTREMITY VENOGRAPHY;  Surgeon: Serene Gaile ORN, MD;  Location: [facility];  Service: Cardiovascular;  Laterality: N/A;   MALONEY DILATION N/A 12/25/2021   Procedure: AGAPITO DILATION;  Surgeon: Shaaron Lamar HERO, MD;  Location: [facility];  Service: Endoscopy;  Laterality: N/A;   PERIPHERAL VASCULAR INTERVENTION  12/18/2022   Procedure: PERIPHERAL VASCULAR INTERVENTION;  Surgeon: Serene Gaile ORN, MD;  Location: [facility];  Service: Cardiovascular;;   PERIPHERAL VASCULAR ULTRASOUND/IVUS  12/18/2022   Procedure: Peripheral Vascular Ultrasound/IVUS;  Surgeon: Serene Gaile ORN, MD;  Location: [facility];  Service: Cardiovascular;;   WISDOM TOOTH EXTRACTION      OB History     Gravida  1   Para  1   Term  1   Preterm      AB      Living  1      SAB      IAB      Ectopic      Multiple  0   Live Births  1            Home Medications    Prior to Admission medications  Medication Sig Start Date End Date Taking? Authorizing Provider  azelastine  (ASTELIN ) 0.1 % nasal spray Place 1 spray into both nostrils 2 (two) times daily. Use in each nostril as directed 03/25/24  Yes Stuart Vernell Norris, PA-C  lidocaine  (XYLOCAINE ) 2 % solution Use as directed 10 mLs in the mouth or throat every 3 (three)  hours as needed. 03/25/24  Yes Stuart Vernell Norris, PA-C  albuterol  (VENTOLIN  HFA) 108 [phone] Base) MCG/ACT inhaler Inhale 1-2 puffs into the lungs every 6 (six) hours as needed for shortness of breath or wheezing. 01/21/22   Fleming, Zelda W, NP  itraconazole (SPORANOX) 100 MG capsule Take 100 mg by mouth daily. 09/01/23   [provider]  ivermectin (STROMECTOL) 3 MG TABS tablet Take 15 mg by mouth 2 (two) times a week.    [provider]  levonorgestrel  (MIRENA , 52 MG,) 20 MCG/DAY IUD 1 each by Intrauterine route once. Placed in 07/2018 07/18/18   [provider]  MISC NATURAL PRODUCTS PO Take 600 Kilo Int'l Units  by mouth once. LUMBROKINASE 2 capsules in AM    [provider]  NALTREXONE HCL PO Take 3 mg by mouth daily.    [provider]  naproxen  (NAPROSYN ) 500 MG tablet Take 1 tablet (500 mg total) by mouth 2 (two) times daily. 02/09/24   Lal, Aanchal, PA-C  ondansetron  (ZOFRAN ) 4 MG tablet Take 1 tablet (4 mg total) by mouth every 6 (six) hours. 02/09/24   Lal, Aanchal, PA-C  progesterone (PROMETRIUM) 100 MG capsule Take 100 mg by mouth at bedtime. Patient taking differently: Take 125 mg by mouth at bedtime.    [provider]  SUMAtriptan  (IMITREX ) 50 MG tablet Take 1 tablet (50 mg total) by mouth every 2 (two) hours as needed for migraine. May repeat in 2 hours if headache persists or recurs. 02/10/24   Jerrell Cleatus Ned, MD    Family History Family History  Problem Relation Age of Onset   Hypertension Mother    Esophageal cancer Father    Hypertension Sister    Heart attack Other    Colon cancer Neg Hx    Inflammatory bowel disease Neg Hx     Social History Social History[1]   Allergies   Other   Review of Systems Review of Systems Per HPI  Physical Exam Triage Vital Signs ED Triage Vitals  Encounter Vitals Group     BP 03/25/24 1607 124/89     Girls Systolic BP Percentile --      Girls Diastolic BP Percentile --      Boys Systolic BP Percentile --      Boys Diastolic BP Percentile --      Pulse Rate 03/25/24 1607 99     Resp 03/25/24 1607 18     Temp 03/25/24 1607 99.1 F (37.3 C)     Temp Source 03/25/24 1607 Oral     SpO2 03/25/24 1607 99 %     Weight --      Height --      Head Circumference --      Peak Flow --      Pain Score 03/25/24 1610 6     Pain Loc --      Pain Education --      Exclude from Growth Chart --    No data found.  Updated Vital Signs BP 124/89 (BP Location: Right Arm)   Pulse 99   Temp 99.1 F (37.3 C) (Oral)   Resp 18   SpO2 99%   Visual Acuity Right Eye Distance:   Left Eye Distance:    Bilateral Distance:    Right Eye Near:   Left Eye Near:    Bilateral Near:     Physical Exam Vitals and nursing note reviewed.  Constitutional:      Appearance: Normal appearance.  HENT:     Head: Atraumatic.     Right Ear: Tympanic membrane and external ear normal.     Left Ear: Tympanic membrane and external ear normal.     Nose: Rhinorrhea present.     Mouth/Throat:     Mouth: Mucous membranes are moist.     Pharynx: Posterior oropharyngeal erythema present. No oropharyngeal exudate.  Eyes:     Extraocular Movements: Extraocular movements intact.     Conjunctiva/sclera: Conjunctivae normal.  Cardiovascular:     Rate and Rhythm: Normal rate and regular rhythm.     Heart sounds: Normal heart sounds.  Pulmonary:     Effort: Pulmonary effort is normal.     Breath sounds: Normal breath sounds. No wheezing.  Musculoskeletal:        General: Normal range of motion.     Cervical back: Normal range of motion and neck supple.  Lymphadenopathy:     Cervical: No cervical adenopathy.  Skin:    General: Skin is warm and dry.  Neurological:     Mental Status: She is alert and oriented to person, place, and time.  Psychiatric:        Mood and Affect: Mood normal.        Thought Content: Thought content normal.      UC Treatments / Results  Labs (all labs ordered are listed, but only abnormal results are displayed) Labs Reviewed  CULTURE, GROUP A STREP [facility])  POCT RAPID STREP A (OFFICE)    EKG   Radiology No results found.  Procedures Procedures (including critical care time)  Medications Ordered in UC Medications - No data to display  Initial Impression / Assessment and Plan / UC Course  I have reviewed the triage vital signs and the nursing notes.  Pertinent labs & imaging results that were available during my care of the patient were reviewed by me and considered in my medical decision making (see chart for details).     Rapid strep negative, throat  culture pending.  Suspect viral pharyngitis.  Will treat with viscous lidocaine , Astelin  for rhinorrhea, supportive over-the-counter medications and home care.  Return for worsening or unresolving symptoms.  Final Clinical Impressions(s) / UC Diagnoses   Final diagnoses:  Sore throat  Fever, unspecified   Discharge Instructions   None    ED Prescriptions     Medication Sig Dispense Auth. Provider   lidocaine  (XYLOCAINE ) 2 % solution Use as directed 10 mLs in the mouth or throat every 3 (three) hours as needed. 100 mL Stuart Vernell Norris, PA-C   azelastine  (ASTELIN ) 0.1 % nasal spray Place 1 spray into both nostrils 2 (two) times daily. Use in each nostril as directed 30 mL Stuart Vernell Norris, PA-C      PDMP not reviewed this encounter.    [1]  Social History Tobacco Use   Smoking status: Never   Smokeless tobacco: Never  Vaping Use   Vaping status: Never Used  Substance Use Topics   Alcohol use: No   Drug use: No     Stuart Vernell Norris, PA-C 03/26/24 1642  "

## 2024-03-28 LAB — CULTURE, GROUP A STREP (THRC)

## 2024-03-29 ENCOUNTER — Ambulatory Visit (HOSPITAL_COMMUNITY): Payer: Self-pay

## 2024-05-04 ENCOUNTER — Ambulatory Visit: Admitting: Surgery

## 2024-05-04 ENCOUNTER — Encounter (HOSPITAL_COMMUNITY)

## 2024-09-11 ENCOUNTER — Encounter: Admitting: Student in an Organized Health Care Education/Training Program
